# Patient Record
Sex: Female | Born: 1965 | Race: Black or African American | Hispanic: No | Marital: Married | Smoking: Never smoker
Health system: Southern US, Community
[De-identification: ages and names within clinical notes are randomized; demographics above are authoritative.]

## PROBLEM LIST (undated history)

## (undated) DIAGNOSIS — H5789 Other specified disorders of eye and adnexa: Secondary | ICD-10-CM

## (undated) DIAGNOSIS — D649 Anemia, unspecified: Secondary | ICD-10-CM

## (undated) DIAGNOSIS — N938 Other specified abnormal uterine and vaginal bleeding: Secondary | ICD-10-CM

## (undated) DIAGNOSIS — I10 Essential (primary) hypertension: Secondary | ICD-10-CM

## (undated) HISTORY — DX: Anemia, unspecified: D64.9

## (undated) HISTORY — DX: Other specified disorders of eye and adnexa: H57.89

## (undated) HISTORY — PX: NO PAST SURGERIES: SHX2092

## (undated) HISTORY — DX: Other specified abnormal uterine and vaginal bleeding: N93.8

---

## 1998-08-01 ENCOUNTER — Ambulatory Visit (HOSPITAL_COMMUNITY): Admission: RE | Admit: 1998-08-01 | Discharge: 1998-08-01 | Payer: Self-pay | Admitting: Family Medicine

## 1998-08-01 ENCOUNTER — Encounter: Payer: Self-pay | Admitting: Family Medicine

## 1999-02-05 ENCOUNTER — Inpatient Hospital Stay (HOSPITAL_COMMUNITY): Admission: AD | Admit: 1999-02-05 | Discharge: 1999-02-07 | Payer: Self-pay | Admitting: *Deleted

## 1999-06-25 ENCOUNTER — Emergency Department (HOSPITAL_COMMUNITY): Admission: EM | Admit: 1999-06-25 | Discharge: 1999-06-25 | Payer: Self-pay | Admitting: Emergency Medicine

## 2002-02-25 ENCOUNTER — Inpatient Hospital Stay (HOSPITAL_COMMUNITY): Admission: AD | Admit: 2002-02-25 | Discharge: 2002-02-28 | Payer: Self-pay | Admitting: *Deleted

## 2004-07-04 ENCOUNTER — Ambulatory Visit: Payer: Self-pay | Admitting: *Deleted

## 2005-01-24 ENCOUNTER — Ambulatory Visit: Payer: Self-pay | Admitting: Nurse Practitioner

## 2005-02-08 ENCOUNTER — Ambulatory Visit: Payer: Self-pay | Admitting: Nurse Practitioner

## 2005-02-11 ENCOUNTER — Ambulatory Visit: Payer: Self-pay | Admitting: Nurse Practitioner

## 2005-02-22 ENCOUNTER — Ambulatory Visit: Payer: Self-pay | Admitting: Nurse Practitioner

## 2005-09-20 ENCOUNTER — Ambulatory Visit: Payer: Self-pay | Admitting: Nurse Practitioner

## 2005-09-27 ENCOUNTER — Ambulatory Visit: Payer: Self-pay | Admitting: Nurse Practitioner

## 2006-03-14 ENCOUNTER — Ambulatory Visit: Payer: Self-pay | Admitting: *Deleted

## 2006-04-24 ENCOUNTER — Ambulatory Visit: Payer: Self-pay | Admitting: Nurse Practitioner

## 2006-05-21 ENCOUNTER — Ambulatory Visit: Payer: Self-pay | Admitting: Nurse Practitioner

## 2006-10-02 ENCOUNTER — Ambulatory Visit: Payer: Self-pay | Admitting: Nurse Practitioner

## 2007-05-20 ENCOUNTER — Encounter (INDEPENDENT_AMBULATORY_CARE_PROVIDER_SITE_OTHER): Payer: Self-pay | Admitting: Nurse Practitioner

## 2007-05-20 ENCOUNTER — Ambulatory Visit: Payer: Self-pay | Admitting: Family Medicine

## 2007-05-20 LAB — CONVERTED CEMR LAB
ALT: 17 units/L (ref 0–35)
AST: 18 units/L (ref 0–37)
Albumin: 3.6 g/dL (ref 3.5–5.2)
Alkaline Phosphatase: 62 units/L (ref 39–117)
BUN: 10 mg/dL (ref 6–23)
Basophils Absolute: 0.1 10*3/uL (ref 0.0–0.1)
Basophils Relative: 1 % (ref 0–1)
CO2: 26 meq/L (ref 19–32)
Calcium: 8.8 mg/dL (ref 8.4–10.5)
Chloride: 104 meq/L (ref 96–112)
Creatinine, Ser: 0.54 mg/dL (ref 0.40–1.20)
Eosinophils Absolute: 0.2 10*3/uL (ref 0.0–0.7)
Eosinophils Relative: 4 % (ref 0–5)
Glucose, Bld: 81 mg/dL (ref 70–99)
HCT: 29.5 % — ABNORMAL LOW (ref 36.0–46.0)
Hemoglobin: 8 g/dL — ABNORMAL LOW (ref 12.0–15.0)
Lymphocytes Relative: 36 % (ref 12–46)
Lymphs Abs: 2.3 10*3/uL (ref 0.7–3.3)
MCHC: 27.1 g/dL — ABNORMAL LOW (ref 30.0–36.0)
MCV: 71.8 fL — ABNORMAL LOW (ref 78.0–100.0)
Monocytes Absolute: 0.6 10*3/uL (ref 0.2–0.7)
Monocytes Relative: 10 % (ref 3–11)
Neutro Abs: 3.2 10*3/uL (ref 1.7–7.7)
Neutrophils Relative %: 50 % (ref 43–77)
Platelets: 252 10*3/uL (ref 150–400)
Potassium: 4.6 meq/L (ref 3.5–5.3)
RBC: 4.11 M/uL (ref 3.87–5.11)
RDW: 19.3 % — ABNORMAL HIGH (ref 11.5–14.0)
Sodium: 137 meq/L (ref 135–145)
TSH: 3.635 microintl units/mL (ref 0.350–5.50)
Total Bilirubin: 0.3 mg/dL (ref 0.3–1.2)
Total Protein: 7 g/dL (ref 6.0–8.3)
WBC: 6.4 10*3/uL (ref 4.0–10.5)

## 2007-06-17 ENCOUNTER — Encounter (INDEPENDENT_AMBULATORY_CARE_PROVIDER_SITE_OTHER): Payer: Self-pay | Admitting: *Deleted

## 2007-09-22 ENCOUNTER — Ambulatory Visit: Payer: Self-pay | Admitting: Family Medicine

## 2007-12-03 ENCOUNTER — Ambulatory Visit: Payer: Self-pay | Admitting: Internal Medicine

## 2008-06-24 ENCOUNTER — Encounter (INDEPENDENT_AMBULATORY_CARE_PROVIDER_SITE_OTHER): Payer: Self-pay | Admitting: Family Medicine

## 2008-06-24 ENCOUNTER — Ambulatory Visit: Payer: Self-pay | Admitting: Internal Medicine

## 2008-06-24 LAB — CONVERTED CEMR LAB
ALT: 19 units/L (ref 0–35)
AST: 18 units/L (ref 0–37)
Albumin: 3.8 g/dL (ref 3.5–5.2)
Alkaline Phosphatase: 52 units/L (ref 39–117)
BUN: 13 mg/dL (ref 6–23)
Basophils Absolute: 0 10*3/uL (ref 0.0–0.1)
Basophils Relative: 1 % (ref 0–1)
CO2: 28 meq/L (ref 19–32)
Calcium: 9.3 mg/dL (ref 8.4–10.5)
Chloride: 102 meq/L (ref 96–112)
Creatinine, Ser: 0.73 mg/dL (ref 0.40–1.20)
Eosinophils Absolute: 0.2 10*3/uL (ref 0.0–0.7)
Eosinophils Relative: 3 % (ref 0–5)
Glucose, Bld: 82 mg/dL (ref 70–99)
HCT: 29.5 % — ABNORMAL LOW (ref 36.0–46.0)
Hemoglobin: 8.6 g/dL — ABNORMAL LOW (ref 12.0–15.0)
Lymphocytes Relative: 39 % (ref 12–46)
Lymphs Abs: 2.5 10*3/uL (ref 0.7–4.0)
MCHC: 29.2 g/dL — ABNORMAL LOW (ref 30.0–36.0)
MCV: 74.7 fL — ABNORMAL LOW (ref 78.0–100.0)
Monocytes Absolute: 0.7 10*3/uL (ref 0.1–1.0)
Monocytes Relative: 12 % (ref 3–12)
Neutro Abs: 2.9 10*3/uL (ref 1.7–7.7)
Neutrophils Relative %: 46 % (ref 43–77)
Platelets: 329 10*3/uL (ref 150–400)
Potassium: 3.9 meq/L (ref 3.5–5.3)
RBC: 3.95 M/uL (ref 3.87–5.11)
RDW: 15.7 % — ABNORMAL HIGH (ref 11.5–15.5)
Sodium: 140 meq/L (ref 135–145)
TSH: 3.396 microintl units/mL (ref 0.350–4.50)
Total Bilirubin: 0.3 mg/dL (ref 0.3–1.2)
Total Protein: 7.1 g/dL (ref 6.0–8.3)
WBC: 6.3 10*3/uL (ref 4.0–10.5)

## 2008-11-08 ENCOUNTER — Ambulatory Visit: Payer: Self-pay | Admitting: Family Medicine

## 2009-01-30 ENCOUNTER — Ambulatory Visit: Payer: Self-pay | Admitting: Family Medicine

## 2009-03-09 ENCOUNTER — Ambulatory Visit: Payer: Self-pay | Admitting: Internal Medicine

## 2009-08-29 ENCOUNTER — Ambulatory Visit: Payer: Self-pay | Admitting: Internal Medicine

## 2009-08-29 ENCOUNTER — Encounter (INDEPENDENT_AMBULATORY_CARE_PROVIDER_SITE_OTHER): Payer: Self-pay | Admitting: Adult Health

## 2009-08-29 LAB — CONVERTED CEMR LAB
Basophils Absolute: 0 10*3/uL (ref 0.0–0.1)
Basophils Relative: 1 % (ref 0–1)
Eosinophils Absolute: 0.2 10*3/uL (ref 0.0–0.7)
Eosinophils Relative: 4 % (ref 0–5)
HCT: 41 % (ref 36.0–46.0)
Hemoglobin: 13.5 g/dL (ref 12.0–15.0)
Lymphocytes Relative: 38 % (ref 12–46)
Lymphs Abs: 2.4 10*3/uL (ref 0.7–4.0)
MCHC: 32.9 g/dL (ref 30.0–36.0)
MCV: 89.5 fL (ref 78.0–100.0)
Monocytes Absolute: 0.7 10*3/uL (ref 0.1–1.0)
Monocytes Relative: 12 % (ref 3–12)
Neutro Abs: 3 10*3/uL (ref 1.7–7.7)
Neutrophils Relative %: 46 % (ref 43–77)
RBC: 4.58 M/uL (ref 3.87–5.11)
RDW: 12.7 % (ref 11.5–15.5)
WBC: 6.4 10*3/uL (ref 4.0–10.5)

## 2009-09-12 ENCOUNTER — Ambulatory Visit: Payer: Self-pay | Admitting: Internal Medicine

## 2010-03-19 ENCOUNTER — Ambulatory Visit: Payer: Self-pay | Admitting: Advanced Practice Midwife

## 2010-03-19 ENCOUNTER — Inpatient Hospital Stay (HOSPITAL_COMMUNITY): Admission: AD | Admit: 2010-03-19 | Discharge: 2010-03-19 | Payer: Self-pay | Admitting: Obstetrics & Gynecology

## 2010-03-21 ENCOUNTER — Ambulatory Visit (HOSPITAL_COMMUNITY): Admission: RE | Admit: 2010-03-21 | Discharge: 2010-03-21 | Payer: Self-pay | Admitting: Obstetrics & Gynecology

## 2010-03-21 ENCOUNTER — Ambulatory Visit: Payer: Self-pay | Admitting: Nurse Practitioner

## 2010-03-28 ENCOUNTER — Ambulatory Visit: Payer: Self-pay | Admitting: Obstetrics and Gynecology

## 2010-03-28 LAB — CONVERTED CEMR LAB: hCG, Beta Chain, Quant, S: 5.9 milliintl units/mL

## 2010-04-04 ENCOUNTER — Ambulatory Visit: Payer: Self-pay | Admitting: Family Medicine

## 2010-04-05 ENCOUNTER — Encounter (INDEPENDENT_AMBULATORY_CARE_PROVIDER_SITE_OTHER): Payer: Self-pay | Admitting: Internal Medicine

## 2010-04-05 LAB — CONVERTED CEMR LAB
BUN: 15 mg/dL (ref 6–23)
Basophils Absolute: 0 10*3/uL (ref 0.0–0.1)
Basophils Relative: 1 % (ref 0–1)
CO2: 28 meq/L (ref 19–32)
Calcium: 9.3 mg/dL (ref 8.4–10.5)
Chloride: 104 meq/L (ref 96–112)
Creatinine, Ser: 0.55 mg/dL (ref 0.40–1.20)
Eosinophils Absolute: 0.3 10*3/uL (ref 0.0–0.7)
Eosinophils Relative: 5 % (ref 0–5)
Ferritin: 14 ng/mL (ref 10–291)
Glucose, Bld: 93 mg/dL (ref 70–99)
HCT: 37 % (ref 36.0–46.0)
Hemoglobin: 11.2 g/dL — ABNORMAL LOW (ref 12.0–15.0)
Iron: 53 ug/dL (ref 42–145)
Lymphocytes Relative: 36 % (ref 12–46)
Lymphs Abs: 2.1 10*3/uL (ref 0.7–4.0)
MCHC: 30.3 g/dL (ref 30.0–36.0)
MCV: 87.9 fL (ref 78.0–100.0)
Microalb, Ur: 1.08 mg/dL (ref 0.00–1.89)
Monocytes Absolute: 0.7 10*3/uL (ref 0.1–1.0)
Monocytes Relative: 11 % (ref 3–12)
Neutro Abs: 2.8 10*3/uL (ref 1.7–7.7)
Neutrophils Relative %: 48 % (ref 43–77)
Platelets: 341 10*3/uL (ref 150–400)
Potassium: 4.5 meq/L (ref 3.5–5.3)
RBC: 4.21 M/uL (ref 3.87–5.11)
RDW: 14.9 % (ref 11.5–15.5)
Saturation Ratios: 18 % — ABNORMAL LOW (ref 20–55)
Sodium: 139 meq/L (ref 135–145)
TIBC: 292 ug/dL (ref 250–470)
UIBC: 239 ug/dL
WBC: 5.9 10*3/uL (ref 4.0–10.5)

## 2010-12-11 ENCOUNTER — Emergency Department (HOSPITAL_COMMUNITY)
Admission: EM | Admit: 2010-12-11 | Discharge: 2010-12-11 | Disposition: A | Discharge status: Home / Self Care | Payer: Self-pay | Attending: Emergency Medicine | Admitting: Emergency Medicine

## 2010-12-11 DIAGNOSIS — M25519 Pain in unspecified shoulder: Secondary | ICD-10-CM | POA: Insufficient documentation

## 2010-12-11 DIAGNOSIS — M546 Pain in thoracic spine: Secondary | ICD-10-CM | POA: Insufficient documentation

## 2010-12-11 DIAGNOSIS — T148XXA Other injury of unspecified body region, initial encounter: Secondary | ICD-10-CM | POA: Insufficient documentation

## 2010-12-11 DIAGNOSIS — M79609 Pain in unspecified limb: Secondary | ICD-10-CM | POA: Insufficient documentation

## 2010-12-11 DIAGNOSIS — Y9289 Other specified places as the place of occurrence of the external cause: Secondary | ICD-10-CM | POA: Insufficient documentation

## 2010-12-11 DIAGNOSIS — I10 Essential (primary) hypertension: Secondary | ICD-10-CM | POA: Insufficient documentation

## 2010-12-11 DIAGNOSIS — X58XXXA Exposure to other specified factors, initial encounter: Secondary | ICD-10-CM | POA: Insufficient documentation

## 2010-12-11 DIAGNOSIS — R209 Unspecified disturbances of skin sensation: Secondary | ICD-10-CM | POA: Insufficient documentation

## 2010-12-16 LAB — CBC
HCT: 34.2 % — ABNORMAL LOW (ref 36.0–46.0)
Hemoglobin: 11.4 g/dL — ABNORMAL LOW (ref 12.0–15.0)
RBC: 3.94 MIL/uL (ref 3.87–5.11)
RDW: 14.3 % (ref 11.5–15.5)

## 2010-12-16 LAB — GC/CHLAMYDIA PROBE AMP, GENITAL: GC Probe Amp, Genital: POSITIVE — AB

## 2010-12-16 LAB — WET PREP, GENITAL
Clue Cells Wet Prep HPF POC: NONE SEEN
Yeast Wet Prep HPF POC: NONE SEEN

## 2010-12-16 LAB — POCT PREGNANCY, URINE: Preg Test, Ur: POSITIVE

## 2011-01-23 ENCOUNTER — Inpatient Hospital Stay (INDEPENDENT_AMBULATORY_CARE_PROVIDER_SITE_OTHER)
Admission: RE | Admit: 2011-01-23 | Discharge: 2011-01-23 | Disposition: A | Discharge status: Home / Self Care | Payer: Self-pay | Source: Ambulatory Visit | Attending: Family Medicine | Admitting: Family Medicine

## 2011-01-23 DIAGNOSIS — R209 Unspecified disturbances of skin sensation: Secondary | ICD-10-CM

## 2011-06-11 ENCOUNTER — Inpatient Hospital Stay (INDEPENDENT_AMBULATORY_CARE_PROVIDER_SITE_OTHER)
Admission: RE | Admit: 2011-06-11 | Discharge: 2011-06-11 | Disposition: A | Discharge status: Home / Self Care | Payer: Self-pay | Source: Ambulatory Visit | Attending: Emergency Medicine | Admitting: Emergency Medicine

## 2011-06-11 DIAGNOSIS — H6690 Otitis media, unspecified, unspecified ear: Secondary | ICD-10-CM

## 2011-10-05 ENCOUNTER — Encounter (HOSPITAL_COMMUNITY): Payer: Self-pay | Admitting: Pharmacist

## 2011-10-14 NOTE — H&P (Signed)
  Patient name  Abigail Gonzalez DICTATION#  [id number] CSN# [id number]  Juluis Mire, MD 10/14/2011 9:22 AM

## 2011-10-15 ENCOUNTER — Other Ambulatory Visit: Payer: Self-pay

## 2011-10-15 ENCOUNTER — Encounter (HOSPITAL_COMMUNITY): Payer: Self-pay

## 2011-10-15 ENCOUNTER — Encounter (HOSPITAL_COMMUNITY)
Admission: RE | Admit: 2011-10-15 | Discharge: 2011-10-15 | Disposition: A | Discharge status: Home / Self Care | Payer: Self-pay | Source: Ambulatory Visit | Attending: Obstetrics and Gynecology | Admitting: Obstetrics and Gynecology

## 2011-10-15 HISTORY — DX: Essential (primary) hypertension: I10

## 2011-10-15 LAB — CBC
HCT: 36 % (ref 36.0–46.0)
MCH: 29.7 pg (ref 26.0–34.0)
MCHC: 32.8 g/dL (ref 30.0–36.0)
MCV: 90.7 fL (ref 78.0–100.0)
Platelets: 330 10*3/uL (ref 150–400)
RDW: 12.9 % (ref 11.5–15.5)
WBC: 6.6 10*3/uL (ref 4.0–10.5)

## 2011-10-15 LAB — COMPREHENSIVE METABOLIC PANEL
AST: 25 U/L (ref 0–37)
Albumin: 3.5 g/dL (ref 3.5–5.2)
BUN: 14 mg/dL (ref 6–23)
Calcium: 9.5 mg/dL (ref 8.4–10.5)
Chloride: 103 mEq/L (ref 96–112)
Creatinine, Ser: 0.57 mg/dL (ref 0.50–1.10)
Total Protein: 7.3 g/dL (ref 6.0–8.3)

## 2011-10-15 NOTE — H&P (Signed)
NAME:  Abigail Gonzalez, Abigail Gonzalez                 ACCOUNT NO.:  MEDICAL RECORD NO.:  [id number]  LOCATION:                                 FACILITY:  PHYSICIAN:  Juluis Mire, M.D.   DATE OF BIRTH:  08/07/1966  DATE OF ADMISSION: DATE OF DISCHARGE:                             HISTORY & PHYSICAL   The patient is a 46 year old, gravida 4, para 3, abortus 1 female, who presented to the office from [facility] for evaluation of prolonged heavy periods, cycles are regular.  She has 3-4 days of heavy flow with clots and cramping.  She was using nothing for birth control.  Saline infusion ultrasound reveals ovaries to be normal.  Did have an apparent endometrial polyp and thickened myometrium that could be secondary to adenomyosis.  She now presents for hysteroscopic resection.  ALLERGIES:  In terms of allergies, she has no known drug allergies.  MEDICATIONS: 1. Hydrochlorothiazide 25 mg a day. 2. Metoprolol 50 mg a day. 3. Iron sulfate supplementation.  PAST MEDICAL HISTORY:  Significant for history of hypertension, under active management.  SURGICAL HISTORY:  No surgical history.  She has had 3 vaginal deliveries, 1 miscarriage.  SOCIAL HISTORY:  No tobacco, alcohol use.  FAMILY HISTORY:  Noncontributory.  REVIEW OF SYSTEMS:  Noncontributory.  PHYSICAL EXAMINATION:  GENERAL:  The patient is afebrile. VITAL SIGNS:  Stable vital signs. HEENT:  The patient is normocephalic.  Pupils equal, round, and reactive to light and accommodation.  Extraocular movements are intact.  Sclerae and conjunctivae are clear.  Oropharynx clear. NECK:  Without thyromegaly. BREASTS:  Not examined. LUNGS:  Clear. CARDIOVASCULAR SYSTEM:  Regular rhythm and rate.  There were no murmurs or gallops. ABDOMEN:  Benign.  No mass, organomegaly, or tenderness. PELVIC:  Normal external genitalia.  Vaginal mucosa is clear.  Cervix unremarkable.  Uterus normal size, shape, and contour.  Adnexa free of masses or  tenderness. EXTREMITIES:  Trace edema. NEUROLOGIC:  Grossly within normal limits.  IMPRESSION: 1. Menorrhagia with endometrial polyp. 2. Hypertension.  PLAN:  The patient will undergo hysteroscopic resection of polyp.  The risks of surgery have been discussed including the risk of infection. Risk of vascular injury could lead to hemorrhage requiring transfusion with the risk of AIDS, or hepatitis.  Excessive bleeding could require hysterectomy.  There is a risk of perforation with injury to adjacent organs requiring further exploratory surgery.  Risk of deep venous thrombosis and pulmonary embolus.  The patient expressed understanding of indications and risks.     Juluis Mire, M.D.     JSM/MEDQ  D:  10/14/2011  T:  10/15/2011  Job:  [id number]

## 2011-10-15 NOTE — Patient Instructions (Addendum)
20 Abigail Gonzalez  10/15/2011   Your procedure is scheduled on:  10/17/11  Enter through the Main Entrance of [facility] at 1130 AM.  Pick up the phone at the desk and dial [phone].   Call this number if you have problems the morning of surgery: [phone]   Remember:   Do not eat food:After Midnight.  Do not drink clear liquids: 4 Hours before arrival.  Take these medicines the morning of surgery with A SIP OF WATER: Blood Pressure medications (2)   Do not wear jewelry, make-up or nail polish.  Do not wear lotions, powders, or perfumes. You may wear deodorant.  Do not shave 48 hours prior to surgery.  Do not bring valuables to the hospital.  Contacts, dentures or bridgework may not be worn into surgery.  Leave suitcase in the car. After surgery it may be brought to your room.  For patients admitted to the hospital, checkout time is 11:00 AM the day of discharge.   Patients discharged the day of surgery will not be allowed to drive home.  Name and phone number of your driver: husband Special Instructions: CHG Shower Use Special Wash: 1/2 bottle night before surgery and 1/2 bottle morning of surgery.   Please read over the following fact sheets that you were given: NA

## 2011-10-15 NOTE — Pre-Procedure Instructions (Signed)
EKG accepted by Dr. Malen Gauze (Anes)  No orders given.  Patton Salles, RN

## 2011-10-17 ENCOUNTER — Other Ambulatory Visit: Payer: Self-pay | Admitting: Obstetrics and Gynecology

## 2011-10-17 ENCOUNTER — Encounter (HOSPITAL_COMMUNITY): Payer: Self-pay | Admitting: Anesthesiology

## 2011-10-17 ENCOUNTER — Encounter (HOSPITAL_COMMUNITY)
Admission: RE | Disposition: A | Discharge status: Home / Self Care | Payer: Self-pay | Source: Ambulatory Visit | Attending: Obstetrics and Gynecology

## 2011-10-17 ENCOUNTER — Ambulatory Visit (HOSPITAL_COMMUNITY)
Admission: RE | Admit: 2011-10-17 | Discharge: 2011-10-17 | Disposition: A | Discharge status: Home / Self Care | Payer: Self-pay | Source: Ambulatory Visit | Attending: Obstetrics and Gynecology | Admitting: Obstetrics and Gynecology

## 2011-10-17 ENCOUNTER — Ambulatory Visit (HOSPITAL_COMMUNITY): Payer: Self-pay | Admitting: Anesthesiology

## 2011-10-17 DIAGNOSIS — I10 Essential (primary) hypertension: Secondary | ICD-10-CM | POA: Insufficient documentation

## 2011-10-17 DIAGNOSIS — Z01818 Encounter for other preprocedural examination: Secondary | ICD-10-CM | POA: Insufficient documentation

## 2011-10-17 DIAGNOSIS — N920 Excessive and frequent menstruation with regular cycle: Secondary | ICD-10-CM | POA: Insufficient documentation

## 2011-10-17 DIAGNOSIS — Z01812 Encounter for preprocedural laboratory examination: Secondary | ICD-10-CM | POA: Insufficient documentation

## 2011-10-17 DIAGNOSIS — N840 Polyp of corpus uteri: Secondary | ICD-10-CM | POA: Insufficient documentation

## 2011-10-17 LAB — HCG, SERUM, QUALITATIVE: Preg, Serum: NEGATIVE

## 2011-10-17 SURGERY — DILATATION & CURETTAGE/HYSTEROSCOPY WITH RESECTOCOPE
Anesthesia: General | Site: Vagina | Wound class: Clean Contaminated

## 2011-10-17 MED ORDER — FENTANYL CITRATE 0.05 MG/ML IJ SOLN
INTRAMUSCULAR | Status: AC
  Filled 2011-10-17: qty 5

## 2011-10-17 MED ORDER — LIDOCAINE HCL (CARDIAC) 20 MG/ML IV SOLN
INTRAVENOUS | Status: DC | PRN
  Administered 2011-10-17: 40 mg via INTRAVENOUS

## 2011-10-17 MED ORDER — PROPOFOL 10 MG/ML IV EMUL
INTRAVENOUS | Status: DC | PRN
  Administered 2011-10-17: 200 mg via INTRAVENOUS

## 2011-10-17 MED ORDER — ONDANSETRON HCL 4 MG/2ML IJ SOLN
INTRAMUSCULAR | Status: DC | PRN
  Administered 2011-10-17: 4 mg via INTRAVENOUS

## 2011-10-17 MED ORDER — GLYCINE 1.5 % IR SOLN
Status: DC | PRN
  Administered 2011-10-17: 3000 mL

## 2011-10-17 MED ORDER — MIDAZOLAM HCL 2 MG/2ML IJ SOLN
INTRAMUSCULAR | Status: AC
Start: 2011-10-17 — End: 2011-10-17
  Filled 2011-10-17: qty 2

## 2011-10-17 MED ORDER — DEXAMETHASONE SODIUM PHOSPHATE 10 MG/ML IJ SOLN
INTRAMUSCULAR | Status: DC | PRN
  Administered 2011-10-17: 10 mg via INTRAVENOUS

## 2011-10-17 MED ORDER — MIDAZOLAM HCL 5 MG/5ML IJ SOLN
INTRAMUSCULAR | Status: DC | PRN
  Administered 2011-10-17: 2 mg via INTRAVENOUS

## 2011-10-17 MED ORDER — FENTANYL CITRATE 0.05 MG/ML IJ SOLN
INTRAMUSCULAR | Status: DC | PRN
  Administered 2011-10-17: 100 ug via INTRAVENOUS

## 2011-10-17 MED ORDER — LACTATED RINGERS IV SOLN
INTRAVENOUS | Status: DC
  Administered 2011-10-17 (×3): via INTRAVENOUS

## 2011-10-17 MED ORDER — LIDOCAINE-EPINEPHRINE (PF) 1 %-1:200000 IJ SOLN
INTRAMUSCULAR | Status: DC | PRN
  Administered 2011-10-17: 15 mL

## 2011-10-17 MED ORDER — KETOROLAC TROMETHAMINE 30 MG/ML IJ SOLN
INTRAMUSCULAR | Status: DC | PRN
  Administered 2011-10-17: 30 mg via INTRAVENOUS

## 2011-10-17 SURGICAL SUPPLY — 14 items
CANISTER SUCTION 2500CC (MISCELLANEOUS) ×2 IMPLANT
CATH ROBINSON RED A/P 16FR (CATHETERS) ×2 IMPLANT
CLOTH BEACON ORANGE TIMEOUT ST (SAFETY) ×2 IMPLANT
CONTAINER PREFILL 10% NBF 60ML (FORM) ×4 IMPLANT
ELECT REM PT RETURN 9FT ADLT (ELECTROSURGICAL) ×2
ELECTRODE REM PT RTRN 9FT ADLT (ELECTROSURGICAL) ×1 IMPLANT
GLOVE BIO SURGEON STRL SZ7 (GLOVE) ×4 IMPLANT
GOWN PREVENTION PLUS LG XLONG (DISPOSABLE) ×2 IMPLANT
GOWN STRL REIN XL XLG (GOWN DISPOSABLE) ×2 IMPLANT
LOOP ANGLED CUTTING 22FR (CUTTING LOOP) IMPLANT
NEEDLE SPNL 20GX3.5 QUINCKE YW (NEEDLE) ×2 IMPLANT
PACK HYSTEROSCOPY LF (CUSTOM PROCEDURE TRAY) ×2 IMPLANT
TOWEL OR 17X24 6PK STRL BLUE (TOWEL DISPOSABLE) ×4 IMPLANT
WATER STERILE IRR 1000ML POUR (IV SOLUTION) ×2 IMPLANT

## 2011-10-17 NOTE — Anesthesia Preprocedure Evaluation (Addendum)
Anesthesia Evaluation  Patient identified by MRN, date of birth, ID band Patient awake    Reviewed: Allergy & Precautions, H&P , Patient's Chart, lab work & pertinent test results, reviewed documented beta blocker date and time   Airway Mallampati: II TM Distance: >3 FB Neck ROM: full    Dental No notable dental hx.    Pulmonary  clear to auscultation  Pulmonary exam normal       Cardiovascular hypertension, Pt. on medications regular Normal    Neuro/Psych    GI/Hepatic   Endo/Other    Renal/GU      Musculoskeletal   Abdominal   Peds  Hematology   Anesthesia Other Findings   Reproductive/Obstetrics                           Anesthesia Physical Anesthesia Plan  ASA: II  Anesthesia Plan: General   Post-op Pain Management:    Induction: Intravenous  Airway Management Planned: LMA  Additional Equipment:   Intra-op Plan:   Post-operative Plan:   Informed Consent: I have reviewed the patients History and Physical, chart, labs and discussed the procedure including the risks, benefits and alternatives for the proposed anesthesia with the patient or authorized representative who has indicated his/her understanding and acceptance.   Dental Advisory Given  Plan Discussed with: CRNA and Surgeon  Anesthesia Plan Comments: (  Discussed  general anesthesia, including possible nausea, instrumentation of airway, sore throat,pulmonary aspiration, etc. I asked if the were any outstanding questions, or  concerns before we proceeded. )        Anesthesia Quick Evaluation

## 2011-10-17 NOTE — Brief Op Note (Signed)
10/17/2011  1:37 PM  PATIENT:  Doreen Salvage Fluharty  46 y.o. female  PRE-OPERATIVE DIAGNOSIS:  polyps  POST-OPERATIVE DIAGNOSIS:  * No post-op diagnosis entered *  PROCEDURE:  Procedure(s): DILATATION & CURETTAGE/HYSTEROSCOPY WITH RESECTOCOPE  SURGEON:  Surgeon(s): Juluis Mire, MD  PHYSICIAN ASSISTANT:   ASSISTANTS: none   ANESTHESIA:   general  EBL:     BLOOD ADMINISTERED:none  DRAINS: none   LOCAL MEDICATIONS USED:  LIDOCAINE 15CC  SPECIMEN:  Source of Specimen:  endometrial ressection and curetting  DISPOSITION OF SPECIMEN:  PATHOLOGY  COUNTS:  YES  TOURNIQUET:  * No tourniquets in log *  DICTATION: .Other Dictation: Dictation Number [id number]  PLAN OF CARE: Discharge to home after PACU  PATIENT DISPOSITION:  PACU - hemodynamically stable.   Delay start of Pharmacological VTE agent (>24hrs) due to surgical blood loss or risk of bleeding:  {YES/NO/NOT APPLICABLE:20182

## 2011-10-17 NOTE — Progress Notes (Signed)
Patient ID: Abigail Gonzalez, female   DOB: Jul 10, 1966, 47 y.o.   MRN: [id number] Patient name DICTATION#  [id number] CSN# [id number]   Abigail Mire, MD 10/17/2011 1:38 PM

## 2011-10-17 NOTE — Transfer of Care (Signed)
Immediate Anesthesia Transfer of Care Note  Patient: Abigail Gonzalez  Procedure(s) Performed:  DILATATION & CURETTAGE/HYSTEROSCOPY WITH RESECTOCOPE  Patient Location: PACU  Anesthesia Type: General  Level of Consciousness: awake, alert , oriented and patient cooperative  Airway & Oxygen Therapy: Patient Spontanous Breathing and Patient connected to nasal cannula oxygen  Post-op Assessment: Report given to PACU RN, Post -op Vital signs reviewed and stable and Patient moving all extremities X 4  Post vital signs: Reviewed and stable Filed Vitals:   10/17/11 1354  BP:   Pulse: 82  Temp: 36.8 C  Resp: 16    Complications: No apparent anesthesia complications

## 2011-10-17 NOTE — H&P (Signed)
  History aqnd physical exam unchanged

## 2011-10-18 NOTE — Op Note (Signed)
NAMEGLENDORA, Abigail Gonzalez            ACCOUNT NO.:  [id number]  MEDICAL RECORD NO.:  [id number]  LOCATION:  [facility]                          FACILITY:  [facility]  PHYSICIAN:  Juluis Mire, M.D.   DATE OF BIRTH:  May 14, 1966  DATE OF PROCEDURE:  10/17/2011 DATE OF DISCHARGE:  10/17/2011                              OPERATIVE REPORT   PREOPERATIVE DIAGNOSIS:  Endometrial polyp.  POSTOPERATIVE DIAGNOSIS:  Endometrial polyp.  OPERATIVE PROCEDURE:  Paracervical block.  Hysteroscopy with resection of endometrium and polyps along with endometrial curettings.  SURGEON:  Juluis Mire, MD  ANESTHESIA:  General.  ESTIMATED BLOOD LOSS:  Minimal.  PACKS:  None.  DRAINS:  None.  INTRAOPERATIVE BLOOD PLACED:  None.  COMPLICATIONS:  None.  INDICATION:  As dictated in history and physical.  PROCEDURE:  The patient was taken to the OR and placed in supine position.  After satisfactory level of general anesthesia was obtained, the patient was placed in the dorsal lithotomy position using the Allen stirrups.  The perineum and vagina were prepped out with Betadine and draped as a sterile field.  The speculum was placed in the vaginal vault.  The cervix was grasped with single-tooth tenaculum. Paracervical block 1% Xylocaine with epinephrine was instituted, we used approximately 15 mL.  At this point in time, uterus sounded to about 9 cm.  Cervix was serially dilated to a size 33 Pratt dilator.  Operative hysteroscope was introduced.  Intrauterine cavity was distended using glycerine.  Visualization revealed markedly thickened endometrium with numerous polyp like outgrowths.  Using the resectoscope, we were able resect all these areas completely.  At the end of the procedure, there was no evidence of any thickened endometrium or polyps.  No signs of perforation or active vaginal bleeding. At this point in time, curettings were obtained.  Single-tooth tenaculum and speculum were then removed.  The  patient was taken out of the dorsal lithotomy position.  Once alert and extubated, transferred to the recovery room in good condition.  Sponge, instrument, and needle counts were reported as correct by circulating nurse x2.     Juluis Mire, M.D.     JSM/MEDQ  D:  10/17/2011  T:  10/18/2011  Job:  [id number]

## 2011-10-18 NOTE — Anesthesia Postprocedure Evaluation (Signed)
  Anesthesia Post-op Note  Patient: Abigail Gonzalez  Procedure(s) Performed:  DILATATION & CURETTAGE/HYSTEROSCOPY WITH RESECTOCOPE  Patient is awake and responsive. Pain and nausea are reasonably well controlled. Vital signs are stable and clinically acceptable. Oxygen saturation is clinically acceptable. There are no apparent anesthetic complications at this time. Patient is ready for discharge.

## 2012-12-21 ENCOUNTER — Encounter (HOSPITAL_COMMUNITY): Payer: Self-pay

## 2012-12-21 ENCOUNTER — Emergency Department (HOSPITAL_COMMUNITY)
Admission: EM | Admit: 2012-12-21 | Discharge: 2012-12-21 | Disposition: A | Discharge status: Home / Self Care | Payer: No Typology Code available for payment source | Source: Home / Self Care | Attending: Family Medicine | Admitting: Family Medicine

## 2012-12-21 DIAGNOSIS — M25512 Pain in left shoulder: Secondary | ICD-10-CM

## 2012-12-21 DIAGNOSIS — R7309 Other abnormal glucose: Secondary | ICD-10-CM

## 2012-12-21 DIAGNOSIS — D649 Anemia, unspecified: Secondary | ICD-10-CM | POA: Insufficient documentation

## 2012-12-21 DIAGNOSIS — I10 Essential (primary) hypertension: Secondary | ICD-10-CM

## 2012-12-21 DIAGNOSIS — M25519 Pain in unspecified shoulder: Secondary | ICD-10-CM

## 2012-12-21 DIAGNOSIS — IMO0002 Reserved for concepts with insufficient information to code with codable children: Secondary | ICD-10-CM

## 2012-12-21 DIAGNOSIS — S43402A Unspecified sprain of left shoulder joint, initial encounter: Secondary | ICD-10-CM | POA: Insufficient documentation

## 2012-12-21 DIAGNOSIS — S43402S Unspecified sprain of left shoulder joint, sequela: Secondary | ICD-10-CM

## 2012-12-21 DIAGNOSIS — R739 Hyperglycemia, unspecified: Secondary | ICD-10-CM

## 2012-12-21 HISTORY — DX: Unspecified sprain of left shoulder joint, initial encounter: S43.402A

## 2012-12-21 LAB — LIPID PANEL: LDL Cholesterol: 107 mg/dL — ABNORMAL HIGH (ref 0–99)

## 2012-12-21 LAB — CBC
HCT: 27.6 % — ABNORMAL LOW (ref 36.0–46.0)
MCHC: 30.8 g/dL (ref 30.0–36.0)
MCV: 72.4 fL — ABNORMAL LOW (ref 78.0–100.0)
RDW: 17 % — ABNORMAL HIGH (ref 11.5–15.5)

## 2012-12-21 LAB — HEMOGLOBIN A1C: Hgb A1c MFr Bld: 5.7 % — ABNORMAL HIGH (ref <5.7)

## 2012-12-21 LAB — COMPREHENSIVE METABOLIC PANEL
ALT: 18 U/L (ref 0–35)
Albumin: 3.6 g/dL (ref 3.5–5.2)
Alkaline Phosphatase: 74 U/L (ref 39–117)
Glucose, Bld: 113 mg/dL — ABNORMAL HIGH (ref 70–99)
Potassium: 3.5 mEq/L (ref 3.5–5.1)
Sodium: 138 mEq/L (ref 135–145)
Total Protein: 7.8 g/dL (ref 6.0–8.3)

## 2012-12-21 MED ORDER — HYDROCHLOROTHIAZIDE 25 MG PO TABS: 25.0000 mg | ORAL_TABLET | Freq: Every day | ORAL | Status: DC

## 2012-12-21 MED ORDER — NAPROXEN 500 MG PO TABS: 500.0000 mg | ORAL_TABLET | Freq: Two times a day (BID) | ORAL | Status: DC | PRN

## 2012-12-21 MED ORDER — METOPROLOL TARTRATE 50 MG PO TABS: 50.0000 mg | ORAL_TABLET | Freq: Two times a day (BID) | ORAL | Status: DC

## 2012-12-21 NOTE — ED Provider Notes (Signed)
History     CSN: [id number]  Arrival date & time 12/21/12  1017   First MD Initiated Contact with Patient 12/21/12 1035      Chief Complaint  Patient presents with  . Shoulder Pain  . Medication Refill    The history is provided by the patient. The history is limited by a language barrier. A language interpreter was used.   Pt reports that she is in need of refills of her blood pressure medications.   Pt says that she is almost out of it completely.  Pt says that she is having left shoulder pain since the last 2 years that comes intermittently.  She says that she had been taking tramadol for the pain in the shoulder.   Pt says that she has not taken her blood pressure medications.  She also takes OTC NSAIDS as needed for her shoulder.    Past Medical History  Diagnosis Date  . Hypertension     Past Surgical History  Procedure Laterality Date  . No past surgeries      No family history on file.  History  Substance Use Topics  . Smoking status: Never Smoker   . Smokeless tobacco: Not on file  . Alcohol Use: No    OB History   Grav Para Term Preterm Abortions TAB SAB Ect Mult Living                  Review of Systems  HENT: Positive for congestion.   Musculoskeletal: Positive for arthralgias.  All other systems reviewed and are negative.    Allergies  Review of patient's allergies indicates no known allergies.  Home Medications   Current Outpatient Rx  Name  Route  Sig  Dispense  Refill  . traMADol (ULTRAM) 50 MG tablet   Oral   Take 50 mg by mouth every 6 (six) hours as needed for pain.         . ferrous sulfate 325 (65 FE) MG tablet   Oral   Take 325 mg by mouth daily with breakfast.           . hydrochlorothiazide (HYDRODIURIL) 25 MG tablet   Oral   Take 25 mg by mouth daily.           Marland Kitchen ibuprofen (ADVIL,MOTRIN) 800 MG tablet   Oral   Take 800 mg by mouth every 8 (eight) hours as needed. For pain          . metoprolol (LOPRESSOR) 50 MG  tablet   Oral   Take 50 mg by mouth 2 (two) times daily.             BP 163/90  Pulse 75  Temp(Src) 98.5 F (36.9 C) (Oral)  Resp 16  SpO2 100%  LMP 12/06/2012  Physical Exam  Nursing note and vitals reviewed. Constitutional: She is oriented to person, place, and time. She appears well-developed and well-nourished. No distress.  HENT:  Head: Normocephalic and atraumatic.  Eyes: Conjunctivae and EOM are normal. Pupils are equal, round, and reactive to light.  Neck: Normal range of motion. Neck supple. No JVD present. No tracheal deviation present. No thyromegaly present.  Cardiovascular: Normal rate, regular rhythm and normal heart sounds.   No murmur heard. Pulmonary/Chest: Effort normal and breath sounds normal. No respiratory distress. She has no wheezes. She has no rales. She exhibits no tenderness.  Abdominal: Soft. Bowel sounds are normal. There is no tenderness. There is no rebound.  Musculoskeletal: Normal  range of motion. She exhibits tenderness. She exhibits no edema.       Arms: Lymphadenopathy:    She has no cervical adenopathy.  Neurological: She is alert and oriented to person, place, and time.  Skin: Skin is warm and dry. No rash noted. No erythema. No pallor.  Psychiatric: She has a normal mood and affect. Her behavior is normal. Judgment and thought content normal.    ED Course  Procedures (including critical care time)  Labs Reviewed - No data to display No results found.   No diagnosis found.    MDM  IMPRESSION  Musculoskeletal left shoulder sprain  Hypertension  Hyperglycemia   RECOMMENDATIONS / PLAN Check labs today:  CMP, A1c, lipid panel, Vit D Refilled her blood pressure medications today Trial of naproxen prn for shoulder pain Recheck BP (nurse visit) in 2 weeks Pt declined flu vaccine  FOLLOW UP 2 weeks for BP check with nurse 6 weeks to follow up left shoulder  The patient was given clear instructions to go to ER or return  to medical center if symptoms don't improve, worsen or new problems develop.  The patient verbalized understanding.  The patient was told to call to get lab results if they haven't heard anything in the next week.     Results for orders placed during the hospital encounter of 12/21/12  COMPREHENSIVE METABOLIC PANEL      Result Value Range   Sodium 138  135 - 145 mEq/L   Potassium 3.5  3.5 - 5.1 mEq/L   Chloride 101  96 - 112 mEq/L   CO2 27  19 - 32 mEq/L   Glucose, Bld 113 (*) 70 - 99 mg/dL   BUN 12  6 - 23 mg/dL   Creatinine, Ser 4.54  0.50 - 1.10 mg/dL   Calcium 9.2  8.4 - 09.8 mg/dL   Total Protein 7.8  6.0 - 8.3 g/dL   Albumin 3.6  3.5 - 5.2 g/dL   AST 19  0 - 37 U/L   ALT 18  0 - 35 U/L   Alkaline Phosphatase 74  39 - 117 U/L   Total Bilirubin 0.3  0.3 - 1.2 mg/dL   GFR calc non Af Amer >90  >90 mL/min   GFR calc Af Amer >90  >90 mL/min  CBC      Result Value Range   WBC 5.6  4.0 - 10.5 K/uL   RBC 3.81 (*) 3.87 - 5.11 MIL/uL   Hemoglobin 8.5 (*) 12.0 - 15.0 g/dL   HCT 11.9 (*) 14.7 - 82.9 %   MCV 72.4 (*) 78.0 - 100.0 fL   MCH 22.3 (*) 26.0 - 34.0 pg   MCHC 30.8  30.0 - 36.0 g/dL   RDW 56.2 (*) 13.0 - 86.5 %   Platelets    150 - 400 K/uL   Value: PLATELET CLUMPING, SUGGEST RECOLLECTION OF SAMPLE IN CITRATE TUBE.  LIPID PANEL      Result Value Range   Cholesterol 176  0 - 200 mg/dL   Triglycerides 53  <784 mg/dL   HDL 58  >69 mg/dL   Total CHOL/HDL Ratio 3.0     VLDL 11  0 - 40 mg/dL   LDL Cholesterol 629 (*) 0 - 99 mg/dL  HEMOGLOBIN B2W      Result Value Range   Hemoglobin A1C 5.7 (*) <5.7 %   Mean Plasma Glucose 117 (*) <117 mg/dL  VITAMIN D 25 HYDROXY  Result Value Range   Vit D, 25-Hydroxy 21 (*) 30 - 89 ng/mL          Cleora Fleet, MD 12/22/12 [phone]

## 2012-12-21 NOTE — ED Notes (Signed)
Patient complains of left shoulder x 1 month. Also needs refill on hypertension medication.

## 2012-12-22 DIAGNOSIS — R7303 Prediabetes: Secondary | ICD-10-CM | POA: Insufficient documentation

## 2012-12-22 DIAGNOSIS — E559 Vitamin D deficiency, unspecified: Secondary | ICD-10-CM | POA: Insufficient documentation

## 2012-12-22 HISTORY — DX: Vitamin D deficiency, unspecified: E55.9

## 2012-12-22 LAB — VITAMIN D 25 HYDROXY (VIT D DEFICIENCY, FRACTURES): Vit D, 25-Hydroxy: 21 ng/mL — ABNORMAL LOW (ref 30–89)

## 2012-12-22 NOTE — Progress Notes (Signed)
Quick Note:  Please notify patient that her blood counts came back low and she is anemic. She needs to start taking daily iron. Please call in iron sulfate 325 mg, take 1 po daily, #30, RFX3, Recheck CBC in 1 month, her vitamin D is low and she should take over the counter Vit D 1000 IU daily. She has prediabetes. Please mail her some information on prediabetes diet and physical activity.   Rodney Langton, MD, CDE, FAAFP [facility] [address] [place], [place]   ______

## 2012-12-23 ENCOUNTER — Telehealth (HOSPITAL_COMMUNITY): Payer: Self-pay

## 2013-05-15 ENCOUNTER — Emergency Department (INDEPENDENT_AMBULATORY_CARE_PROVIDER_SITE_OTHER)
Admission: EM | Admit: 2013-05-15 | Discharge: 2013-05-15 | Disposition: A | Discharge status: Home / Self Care | Payer: Self-pay | Source: Home / Self Care | Attending: Family Medicine | Admitting: Family Medicine

## 2013-05-15 ENCOUNTER — Encounter (HOSPITAL_COMMUNITY): Payer: Self-pay | Admitting: *Deleted

## 2013-05-15 DIAGNOSIS — I10 Essential (primary) hypertension: Secondary | ICD-10-CM

## 2013-05-15 MED ORDER — HYDROCHLOROTHIAZIDE 25 MG PO TABS: 25.0000 mg | ORAL_TABLET | Freq: Every day | ORAL | Status: DC

## 2013-05-15 MED ORDER — METOPROLOL TARTRATE 50 MG PO TABS: 50.0000 mg | ORAL_TABLET | Freq: Two times a day (BID) | ORAL | Status: DC

## 2013-05-15 NOTE — ED Notes (Signed)
Will run out of hydrochlorothiazide and metoprolol tomorrow.  Does not have PCP.

## 2013-05-15 NOTE — ED Provider Notes (Signed)
CSN: [id number]     Arrival date & time 05/15/13  0912 History     First MD Initiated Contact with Patient 05/15/13 [phone]     Chief Complaint  Patient presents with  . Medication Refill   (Consider location/radiation/quality/duration/timing/severity/associated sxs/prior Treatment) Patient is a 47 y.o. female presenting with hypertension. The history is provided by the patient.  Hypertension This is a chronic problem. Episode onset: only has 1 pill left of bp meds, here for refill. The problem has not changed since onset.Pertinent negatives include no chest pain, no abdominal pain and no headaches.    Past Medical History  Diagnosis Date  . Hypertension    Past Surgical History  Procedure Laterality Date  . No past surgeries     No family history on file. History  Substance Use Topics  . Smoking status: Never Smoker   . Smokeless tobacco: Not on file  . Alcohol Use: No   OB History   Grav Para Term Preterm Abortions TAB SAB Ect Mult Living                 Review of Systems  Constitutional: Negative.   Respiratory: Negative.   Cardiovascular: Negative for chest pain, palpitations and leg swelling.  Gastrointestinal: Negative for abdominal pain.  Neurological: Negative for dizziness and headaches.    Allergies  Review of patient's allergies indicates no known allergies.  Home Medications   Current Outpatient Rx  Name  Route  Sig  Dispense  Refill  . Cholecalciferol (VITAMIN D PO)   Oral   Take by mouth.         . ferrous sulfate 325 (65 FE) MG tablet   Oral   Take 325 mg by mouth daily with breakfast.           . hydrochlorothiazide (HYDRODIURIL) 25 MG tablet   Oral   Take 1 tablet (25 mg total) by mouth daily.   30 tablet   3   . metoprolol (LOPRESSOR) 50 MG tablet   Oral   Take 1 tablet (50 mg total) by mouth 2 (two) times daily.   60 tablet   3   . naproxen (NAPROSYN) 500 MG tablet   Oral   Take 1 tablet (500 mg total) by mouth 2 (two) times  daily between meals as needed (left shoulder pain).   30 tablet   0   . hydrochlorothiazide (HYDRODIURIL) 25 MG tablet   Oral   Take 1 tablet (25 mg total) by mouth daily.   30 tablet   1   . metoprolol (LOPRESSOR) 50 MG tablet   Oral   Take 1 tablet (50 mg total) by mouth 2 (two) times daily.   60 tablet   1   . traMADol (ULTRAM) 50 MG tablet   Oral   Take 50 mg by mouth every 6 (six) hours as needed for pain.          BP 173/82  Pulse 85  Temp(Src) 98.1 F (36.7 C)  Resp 16  SpO2 100%  LMP 04/22/2013 Physical Exam  Nursing note and vitals reviewed. Constitutional: She is oriented to person, place, and time. She appears well-developed and well-nourished.  Neck: Normal range of motion. Neck supple.  Cardiovascular: Regular rhythm, normal heart sounds and intact distal pulses.   Pulmonary/Chest: Effort normal and breath sounds normal.  Lymphadenopathy:    She has no cervical adenopathy.  Neurological: She is alert and oriented to person, place, and time.  Skin: Skin  is warm and dry.    ED Course   Procedures (including critical care time)  Labs Reviewed - No data to display No results found. 1. Hypertension, essential, benign     MDM    Linna Hoff, MD 05/15/13 [phone]

## 2013-06-30 ENCOUNTER — Encounter: Payer: Self-pay | Admitting: Internal Medicine

## 2013-06-30 ENCOUNTER — Ambulatory Visit: Payer: No Typology Code available for payment source | Attending: Internal Medicine | Admitting: Internal Medicine

## 2013-06-30 VITALS — BP 147/91 | HR 80 | Temp 98.1°F | Resp 16 | Ht 63.39 in | Wt 191.0 lb

## 2013-06-30 DIAGNOSIS — R7303 Prediabetes: Secondary | ICD-10-CM

## 2013-06-30 DIAGNOSIS — E559 Vitamin D deficiency, unspecified: Secondary | ICD-10-CM

## 2013-06-30 DIAGNOSIS — I10 Essential (primary) hypertension: Secondary | ICD-10-CM | POA: Insufficient documentation

## 2013-06-30 DIAGNOSIS — D649 Anemia, unspecified: Secondary | ICD-10-CM

## 2013-06-30 DIAGNOSIS — R7309 Other abnormal glucose: Secondary | ICD-10-CM | POA: Insufficient documentation

## 2013-06-30 MED ORDER — FERROUS SULFATE 325 (65 FE) MG PO TABS: 325.0000 mg | ORAL_TABLET | Freq: Every day | ORAL | Status: DC

## 2013-06-30 MED ORDER — NAPROXEN 500 MG PO TABS: 500.0000 mg | ORAL_TABLET | Freq: Two times a day (BID) | ORAL | Status: DC | PRN

## 2013-06-30 MED ORDER — TRAMADOL HCL 50 MG PO TABS: 50.0000 mg | ORAL_TABLET | Freq: Four times a day (QID) | ORAL | Status: DC | PRN

## 2013-06-30 MED ORDER — HYDROCHLOROTHIAZIDE 25 MG PO TABS: 25.0000 mg | ORAL_TABLET | Freq: Every day | ORAL | Status: DC

## 2013-06-30 MED ORDER — METOPROLOL TARTRATE 50 MG PO TABS: 50.0000 mg | ORAL_TABLET | Freq: Two times a day (BID) | ORAL | Status: DC

## 2013-06-30 MED ORDER — VITAMIN D 50 MCG (2000 UT) PO TABS: 2000.0000 [IU] | ORAL_TABLET | Freq: Every day | ORAL | Status: DC

## 2013-06-30 NOTE — Progress Notes (Signed)
Pt is here to establish care. Pt is requesting medication for her HTN.

## 2013-06-30 NOTE — Progress Notes (Signed)
Patient ID: Abigail Gonzalez, female   DOB: 09-Nov-1965, 47 y.o.   MRN: [id number] Patient Demographics  Abigail Gonzalez, is a 47 y.o. female  [id number]  [id number]  DOB - April 26, 1966  Chief Complaint  Patient presents with  . Establish Care        Subjective:   Abigail Gonzalez today is here to establish primary care. Patient is a 47 year old female with history of hypertension, prediabetes is here to establish care. Patient has No headache, No chest pain, No abdominal pain - No Nausea, No new weakness tingling or numbness, No Cough - SOB. States she wants refills on her blood pressure medications.  Objective:    Filed Vitals:   06/30/13 1013  BP: 147/91  Pulse: 80  Temp: 98.1 F (36.7 C)  TempSrc: Oral  Resp: 16  Height: 5' 3.39" (1.61 m)  Weight: 191 lb (86.637 kg)  SpO2: 100%     ALLERGIES:  No Known Allergies  PAST MEDICAL HISTORY: Past Medical History  Diagnosis Date  . Hypertension     PAST SURGICAL HISTORY: Past Surgical History  Procedure Laterality Date  . No past surgeries      FAMILY HISTORY: History reviewed. No pertinent family history.  MEDICATIONS AT HOME: Prior to Admission medications   Medication Sig Start Date End Date Taking? Authorizing Provider  Cholecalciferol (VITAMIN D PO) Take by mouth.    Historical Provider, MD  ferrous sulfate 325 (65 FE) MG tablet Take 1 tablet (325 mg total) by mouth daily with breakfast. 06/30/13   Ripudeep Jenna Luo, MD  hydrochlorothiazide (HYDRODIURIL) 25 MG tablet Take 1 tablet (25 mg total) by mouth daily. 12/21/12   Clanford Cyndie Mull, MD  hydrochlorothiazide (HYDRODIURIL) 25 MG tablet Take 1 tablet (25 mg total) by mouth daily. 06/30/13   Ripudeep Jenna Luo, MD  metoprolol (LOPRESSOR) 50 MG tablet Take 1 tablet (50 mg total) by mouth 2 (two) times daily. 12/21/12   Clanford Cyndie Mull, MD  metoprolol (LOPRESSOR) 50 MG tablet Take 1 tablet (50 mg total) by mouth 2 (two) times daily. 06/30/13   Ripudeep Jenna Luo,  MD  naproxen (NAPROSYN) 500 MG tablet Take 1 tablet (500 mg total) by mouth 2 (two) times daily between meals as needed (left shoulder pain). 06/30/13   Ripudeep Jenna Luo, MD  traMADol (ULTRAM) 50 MG tablet Take 1 tablet (50 mg total) by mouth every 6 (six) hours as needed for pain. 06/30/13   Ripudeep Jenna Luo, MD    REVIEW OF SYSTEMS:  Constitutional:   No   Fevers, chills, fatigue.  HEENT:    No headaches, Sore throat,   Cardio-vascular: No chest pain,  Orthopnea, swelling in lower extremities, anasarca, palpitations  GI:  No abdominal pain, nausea, vomiting, diarrhea  Resp: No shortness of breath,  No coughing up of blood.No cough.No wheezing.  Skin:  no rash or lesions.  GU:  no dysuria, change in color of urine, no urgency or frequency.  No flank pain.  Musculoskeletal: No joint pain or swelling.  No decreased range of motion.  No back pain.  Psych: No change in mood or affect. No depression or anxiety.  No memory loss.   Exam  General appearance :Awake, alert, NAD, Speech Clear. HEENT: Atraumatic and Normocephalic, PERLA Neck: supple, no JVD. No cervical lymphadenopathy.  Chest: clear to auscultation bilaterally, no wheezing, rales or rhonchi CVS: S1 S2 regular, no murmurs.  Abdomen: soft, NBS, NT, ND, no gaurding, rigidity or rebound. Extremities: No cyanosis, clubbing, B/L  Lower Ext shows no edema,  Neurology: Awake alert, and oriented X 3, CN II-XII intact, Non focal Skin:No Rash or lesions Wounds: N/A    Data Review   Basic Metabolic Panel: No results found for this basename: NA, K, CL, CO2, GLUCOSE, BUN, CREATININE, CALCIUM, MG, PHOS,  in the last 168 hours Liver Function Tests: No results found for this basename: AST, ALT, ALKPHOS, BILITOT, PROT, ALBUMIN,  in the last 168 hours  CBC: No results found for this basename: WBC, NEUTROABS, HGB, HCT, MCV, PLT,  in the last 168  hours ------------------------------------------------------------------------------------------------------------------ No results found for this basename: HGBA1C,  in the last 72 hours ------------------------------------------------------------------------------------------------------------------ No results found for this basename: CHOL, HDL, LDLCALC, TRIG, CHOLHDL, LDLDIRECT,  in the last 72 hours ------------------------------------------------------------------------------------------------------------------ No results found for this basename: TSH, T4TOTAL, FREET3, T3FREE, THYROIDAB,  in the last 72 hours ------------------------------------------------------------------------------------------------------------------ No results found for this basename: VITAMINB12, FOLATE, FERRITIN, TIBC, IRON, RETICCTPCT,  in the last 72 hours  Coagulation profile  No results found for this basename: INR, PROTIME,  in the last 168 hours    Assessment & Plan   Active Problems: Hypertension - Refills atenolol and HCTZ  History of prediabetes: -Counseled patient on diet and weight control, exercise   - Will check hemoglobin A1c at next visit  - Previous hemoglobin A1c 5.7 in March 2014  History of anemia - Last labs in March 2014 hemoglobin of 8.5, patient states periods usually last 5-7 days  - Continue iron supplementation   Health screening :  -She declined flu shot  -  Ambulatory referral for OB-GYN for Pap smear , last Pap smear was 2 years - Screening mammogram ordered, per patient had never done mammogram   Recommendations:CBC, BMET, hemoglobin A1c, lipid panel ordered for next visit   Follow-up in  3 months    RAI,RIPUDEEP M.D. 06/30/2013, 10:20 AM

## 2013-07-08 ENCOUNTER — Encounter: Payer: Self-pay | Admitting: Obstetrics & Gynecology

## 2013-07-15 ENCOUNTER — Ambulatory Visit
Admission: RE | Admit: 2013-07-15 | Discharge: 2013-07-15 | Disposition: A | Discharge status: Home / Self Care | Payer: No Typology Code available for payment source | Source: Ambulatory Visit | Attending: Internal Medicine | Admitting: Internal Medicine

## 2013-07-15 DIAGNOSIS — I10 Essential (primary) hypertension: Secondary | ICD-10-CM

## 2013-07-15 DIAGNOSIS — R7303 Prediabetes: Secondary | ICD-10-CM

## 2013-07-21 ENCOUNTER — Other Ambulatory Visit: Payer: Self-pay | Admitting: Internal Medicine

## 2013-07-21 DIAGNOSIS — R928 Other abnormal and inconclusive findings on diagnostic imaging of breast: Secondary | ICD-10-CM

## 2013-08-04 ENCOUNTER — Ambulatory Visit
Admission: RE | Admit: 2013-08-04 | Discharge: 2013-08-04 | Disposition: A | Discharge status: Home / Self Care | Payer: No Typology Code available for payment source | Source: Ambulatory Visit | Attending: Internal Medicine | Admitting: Internal Medicine

## 2013-08-04 DIAGNOSIS — R928 Other abnormal and inconclusive findings on diagnostic imaging of breast: Secondary | ICD-10-CM

## 2013-08-05 ENCOUNTER — Other Ambulatory Visit: Payer: Self-pay

## 2013-08-19 ENCOUNTER — Encounter: Payer: No Typology Code available for payment source | Admitting: Obstetrics & Gynecology

## 2013-08-25 ENCOUNTER — Telehealth: Payer: Self-pay | Admitting: Internal Medicine

## 2013-08-25 ENCOUNTER — Other Ambulatory Visit: Payer: Self-pay | Admitting: Internal Medicine

## 2013-08-25 ENCOUNTER — Other Ambulatory Visit: Payer: Self-pay | Admitting: Emergency Medicine

## 2013-08-25 MED ORDER — TRAMADOL HCL 50 MG PO TABS: 50.0000 mg | ORAL_TABLET | Freq: Four times a day (QID) | ORAL | Status: DC | PRN

## 2013-08-25 NOTE — Telephone Encounter (Signed)
Pt's pharmacy called in today to see if she can get script refill for traMADol (ULTRAM) 50 MG tabletl;

## 2013-09-30 ENCOUNTER — Ambulatory Visit: Payer: No Typology Code available for payment source | Admitting: Family Medicine

## 2013-10-07 ENCOUNTER — Ambulatory Visit: Payer: No Typology Code available for payment source

## 2013-12-15 ENCOUNTER — Ambulatory Visit: Payer: No Typology Code available for payment source | Attending: Internal Medicine

## 2014-01-18 ENCOUNTER — Telehealth: Payer: Self-pay | Admitting: Internal Medicine

## 2014-01-18 NOTE — Telephone Encounter (Signed)
Pt came into the office regarding a refill of her blood pressure medication, the next available appointment will be for 6/16 pt is schedule for that day. Pt would like a refill of her medication until she get to see the doctor in June. Please contact pt

## 2014-01-24 ENCOUNTER — Other Ambulatory Visit: Payer: Self-pay | Admitting: Internal Medicine

## 2014-01-24 DIAGNOSIS — I10 Essential (primary) hypertension: Secondary | ICD-10-CM

## 2014-03-15 ENCOUNTER — Ambulatory Visit: Payer: No Typology Code available for payment source | Attending: Internal Medicine | Admitting: Internal Medicine

## 2014-03-15 ENCOUNTER — Encounter: Payer: Self-pay | Admitting: Internal Medicine

## 2014-03-15 VITALS — BP 160/90 | HR 77 | Temp 98.0°F | Resp 16 | Wt 191.8 lb

## 2014-03-15 DIAGNOSIS — R7309 Other abnormal glucose: Secondary | ICD-10-CM | POA: Insufficient documentation

## 2014-03-15 DIAGNOSIS — Z79899 Other long term (current) drug therapy: Secondary | ICD-10-CM | POA: Insufficient documentation

## 2014-03-15 DIAGNOSIS — D649 Anemia, unspecified: Secondary | ICD-10-CM | POA: Insufficient documentation

## 2014-03-15 DIAGNOSIS — R7303 Prediabetes: Secondary | ICD-10-CM

## 2014-03-15 DIAGNOSIS — E559 Vitamin D deficiency, unspecified: Secondary | ICD-10-CM

## 2014-03-15 DIAGNOSIS — I10 Essential (primary) hypertension: Secondary | ICD-10-CM

## 2014-03-15 DIAGNOSIS — Z139 Encounter for screening, unspecified: Secondary | ICD-10-CM

## 2014-03-15 LAB — CBC WITH DIFFERENTIAL/PLATELET
BASOS PCT: 1 % (ref 0–1)
Basophils Absolute: 0.1 10*3/uL (ref 0.0–0.1)
EOS ABS: 0.2 10*3/uL (ref 0.0–0.7)
EOS PCT: 3 % (ref 0–5)
HCT: 30.4 % — ABNORMAL LOW (ref 36.0–46.0)
HEMOGLOBIN: 9.5 g/dL — AB (ref 12.0–15.0)
LYMPHS ABS: 1.7 10*3/uL (ref 0.7–4.0)
Lymphocytes Relative: 31 % (ref 12–46)
MCH: 24.5 pg — AB (ref 26.0–34.0)
MCHC: 31.3 g/dL (ref 30.0–36.0)
MCV: 78.6 fL (ref 78.0–100.0)
MONOS PCT: 11 % (ref 3–12)
Monocytes Absolute: 0.6 10*3/uL (ref 0.1–1.0)
Neutro Abs: 3 10*3/uL (ref 1.7–7.7)
Neutrophils Relative %: 54 % (ref 43–77)
PLATELETS: 352 10*3/uL (ref 150–400)
RBC: 3.87 MIL/uL (ref 3.87–5.11)
RDW: 19.8 % — ABNORMAL HIGH (ref 11.5–15.5)
WBC: 5.6 10*3/uL (ref 4.0–10.5)

## 2014-03-15 LAB — COMPLETE METABOLIC PANEL WITH GFR
ALBUMIN: 3.7 g/dL (ref 3.5–5.2)
ALT: 20 U/L (ref 0–35)
AST: 16 U/L (ref 0–37)
Alkaline Phosphatase: 59 U/L (ref 39–117)
BILIRUBIN TOTAL: 0.3 mg/dL (ref 0.2–1.2)
BUN: 13 mg/dL (ref 6–23)
CO2: 26 meq/L (ref 19–32)
Calcium: 9.2 mg/dL (ref 8.4–10.5)
Chloride: 103 mEq/L (ref 96–112)
Creat: 0.71 mg/dL (ref 0.50–1.10)
GFR, Est Non African American: >89 mL/min
GLUCOSE: 106 mg/dL — AB (ref 70–99)
POTASSIUM: 3.7 meq/L (ref 3.5–5.3)
SODIUM: 135 meq/L (ref 135–145)
TOTAL PROTEIN: 7 g/dL (ref 6.0–8.3)

## 2014-03-15 MED ORDER — METOPROLOL TARTRATE 50 MG PO TABS: 50.0000 mg | ORAL_TABLET | Freq: Two times a day (BID) | ORAL | Status: DC

## 2014-03-15 MED ORDER — FERROUS SULFATE 325 (65 FE) MG PO TABS: 325.0000 mg | ORAL_TABLET | Freq: Every day | ORAL | Status: DC

## 2014-03-15 MED ORDER — HYDROCHLOROTHIAZIDE 25 MG PO TABS: ORAL_TABLET | ORAL | Status: DC

## 2014-03-15 NOTE — Patient Instructions (Addendum)
Diabetes Meal Planning Guide The diabetes meal planning guide is a tool to help you plan your meals and snacks. It is important for people with diabetes to manage their blood glucose (sugar) levels. Choosing the right foods and the right amounts throughout your day will help control your blood glucose. Eating right can even help you improve your blood pressure and reach or maintain a healthy weight. CARBOHYDRATE COUNTING MADE EASY When you eat carbohydrates, they turn to sugar. This raises your blood glucose level. Counting carbohydrates can help you control this level so you feel better. When you plan your meals by counting carbohydrates, you can have more flexibility in what you eat and balance your medicine with your food intake. Carbohydrate counting simply means adding up the total amount of carbohydrate grams in your meals and snacks. Try to eat about the same amount at each meal. Foods with carbohydrates are listed below. Each portion below is 1 carbohydrate serving or 15 grams of carbohydrates. Ask your dietician how many grams of carbohydrates you should eat at each meal or snack. Grains and Starches  1 slice bread.   English muffin or hotdog/hamburger bun.   cup cold cereal (unsweetened).   cup cooked pasta or rice.   cup starchy vegetables (corn, potatoes, peas, beans, winter squash).  1 tortilla (6 inches).   bagel.  1 waffle or pancake (size of a CD).   cup cooked cereal.  4 to 6 small crackers. *Whole grain is recommended. Fruit  1 cup fresh unsweetened berries, melon, papaya, pineapple.  1 small fresh fruit.   banana or mango.   cup fruit juice (4 oz unsweetened).   cup canned fruit in natural juice or water.  2 tbs dried fruit.  12 to 15 grapes or cherries. Milk and Yogurt  1 cup fat-free or 1% milk.  1 cup soy milk.  6 oz light yogurt with sugar-free sweetener.  6 oz low-fat soy yogurt.  6 oz plain yogurt. Vegetables  1 cup raw or  cup  cooked is counted as 0 carbohydrates or a "free" food.  If you eat 3 or more servings at 1 meal, count them as 1 carbohydrate serving. Other Carbohydrates   oz chips or pretzels.   cup ice cream or frozen yogurt.   cup sherbet or sorbet.  2 inch square cake, no frosting.  1 tbs honey, sugar, jam, jelly, or syrup.  2 small cookies.  3 squares of graham crackers.  3 cups popcorn.  6 crackers.  1 cup broth-based soup.  Count 1 cup casserole or other mixed foods as 2 carbohydrate servings.  Foods with less than 20 calories in a serving may be counted as 0 carbohydrates or a "free" food. You may want to purchase a book or computer software that lists the carbohydrate gram counts of different foods. In addition, the nutrition facts panel on the labels of the foods you eat are a good source of this information. The label will tell you how big the serving size is and the total number of carbohydrate grams you will be eating per serving. Divide this number by 15 to obtain the number of carbohydrate servings in a portion. Remember, 1 carbohydrate serving equals 15 grams of carbohydrate. SERVING SIZES Measuring foods and serving sizes helps you make sure you are getting the right amount of food. The list below tells how big or small some common serving sizes are.  1 oz.........4 stacked dice.  3 oz.........Deck of cards.  1 tsp........Tip   of little finger.  1 tbs........Thumb.  2 tbs........Golf ball.   cup.......Half of a fist.  1 cup........A fist. SAMPLE DIABETES MEAL PLAN Below is a sample meal plan that includes foods from the grain and starches, dairy, vegetable, fruit, and meat groups. A dietician can individualize a meal plan to fit your calorie needs and tell you the number of servings needed from each food group. However, controlling the total amount of carbohydrates in your meal or snack is more important than making sure you include all of the food groups at every  meal. You may interchange carbohydrate containing foods (dairy, starches, and fruits). The meal plan below is an example of a 2000 calorie diet using carbohydrate counting. This meal plan has 17 carbohydrate servings. Breakfast  1 cup oatmeal (2 carb servings).   cup light yogurt (1 carb serving).  1 cup blueberries (1 carb serving).   cup almonds. Snack  1 large apple (2 carb servings).  1 low-fat string cheese stick. Lunch  Chicken breast salad.  1 cup spinach.   cup chopped tomatoes.  2 oz chicken breast, sliced.  2 tbs low-fat Italian dressing.  12 whole-wheat crackers (2 carb servings).  12 to 15 grapes (1 carb serving).  1 cup low-fat milk (1 carb serving). Snack  1 cup carrots.   cup hummus (1 carb serving). Dinner  3 oz broiled salmon.  1 cup brown rice (3 carb servings). Snack  1  cups steamed broccoli (1 carb serving) drizzled with 1 tsp olive oil and lemon juice.  1 cup light pudding (2 carb servings). DIABETES MEAL PLANNING WORKSHEET Your dietician can use this worksheet to help you decide how many servings of foods and what types of foods are right for you.  BREAKFAST Food Group and Servings / Carb Servings Grain/Starches __________________________________ Dairy __________________________________________ Vegetable ______________________________________ Fruit ___________________________________________ Meat __________________________________________ Fat ____________________________________________ LUNCH Food Group and Servings / Carb Servings Grain/Starches ___________________________________ Dairy ___________________________________________ Fruit ____________________________________________ Meat ___________________________________________ Fat _____________________________________________ DINNER Food Group and Servings / Carb Servings Grain/Starches ___________________________________ Dairy  ___________________________________________ Fruit ____________________________________________ Meat ___________________________________________ Fat _____________________________________________ SNACKS Food Group and Servings / Carb Servings Grain/Starches ___________________________________ Dairy ___________________________________________ Vegetable _______________________________________ Fruit ____________________________________________ Meat ___________________________________________ Fat _____________________________________________ DAILY TOTALS Starches _________________________ Vegetable ________________________ Fruit ____________________________ Dairy ____________________________ Meat ____________________________ Fat ______________________________ Document Released: 06/13/2005 Document Revised: 12/09/2011 Document Reviewed: 04/24/2009 [employer] Patient Information 2014 [employer] DASH Diet The DASH diet stands for "Dietary Approaches to Stop Hypertension." It is a healthy eating plan that has been shown to reduce high blood pressure (hypertension) in as little as 14 days, while also possibly providing other significant health benefits. These other health benefits include reducing the risk of breast cancer after menopause and reducing the risk of type 2 diabetes, heart disease, colon cancer, and stroke. Health benefits also include weight loss and slowing kidney failure in patients with chronic kidney disease.  DIET GUIDELINES  Limit salt (sodium). Your diet should contain less than 1500 mg of sodium daily.  Limit refined or processed carbohydrates. Your diet should include mostly whole grains. Desserts and added sugars should be used sparingly.  Include small amounts of heart-healthy fats. These types of fats include nuts, oils, and tub margarine. Limit saturated and trans fats. These fats have been shown to be harmful in the body. CHOOSING FOODS  The following food groups  are based on a 2000 calorie diet. See your Registered Dietitian for individual calorie needs. Grains and Grain Products (6 to 8 servings daily)  Eat More Often:   Whole-wheat bread, brown rice, whole-grain or wheat pasta, quinoa, popcorn without added fat or salt (air popped).  Eat Less Often: White bread, white pasta, white rice, cornbread. Vegetables (4 to 5 servings daily)  Eat More Often: Fresh, frozen, and canned vegetables. Vegetables may be raw, steamed, roasted, or grilled with a minimal amount of fat.  Eat Less Often/Avoid: Creamed or fried vegetables. Vegetables in a cheese sauce. Fruit (4 to 5 servings daily)  Eat More Often: All fresh, canned (in natural juice), or frozen fruits. Dried fruits without added sugar. One hundred percent fruit juice ( cup [237 mL] daily).  Eat Less Often: Dried fruits with added sugar. Canned fruit in light or heavy syrup. Lean Meats, Fish, and Poultry (2 servings or less daily. One serving is 3 to 4 oz [85-114 g]).  Eat More Often: Ninety percent or leaner ground beef, tenderloin, sirloin. Round cuts of beef, chicken breast, turkey breast. All fish. Grill, bake, or broil your meat. Nothing should be fried.  Eat Less Often/Avoid: Fatty cuts of meat, turkey, or chicken leg, thigh, or wing. Fried cuts of meat or fish. Dairy (2 to 3 servings)  Eat More Often: Low-fat or fat-free milk, low-fat plain or light yogurt, reduced-fat or part-skim cheese.  Eat Less Often/Avoid: Milk (whole, 2%).Whole milk yogurt. Full-fat cheeses. Nuts, Seeds, and Legumes (4 to 5 servings per week)  Eat More Often: All without added salt.  Eat Less Often/Avoid: Salted nuts and seeds, canned beans with added salt. Fats and Sweets (limited)  Eat More Often: Vegetable oils, tub margarines without trans fats, sugar-free gelatin. Mayonnaise and salad dressings.  Eat Less Often/Avoid: Coconut oils, palm oils, butter, stick margarine, cream, half and half, cookies, candy,  pie. FOR MORE INFORMATION The Dash Diet Eating Plan: www.dashdiet.org Document Released: 09/05/2011 Document Revised: 12/09/2011 Document Reviewed: 09/05/2011 [employer] Patient Information 2014 [employer].  

## 2014-03-15 NOTE — Progress Notes (Signed)
Patient here for follow up on her HTN And to get her medications refilled

## 2014-03-15 NOTE — Progress Notes (Signed)
MRN: [id number] Name: Abigail Gonzalez  Sex: female Age: 48 y.o. DOB: 06-27-1966  Allergies: Review of patient's allergies indicates no known allergies.  Chief Complaint  Patient presents with  . Follow-up    HPI: Patient is 48 y.o. female who has history of hypertension, prediabetes, anemia vitamin D deficiency comes today for followup and requesting refill on her medications, today her blood pressure is elevated, she denies any headache dizziness chest and shortness of breath, her manual blood pressure is 160/90, as per patient she has not been taking metoprolol twice a day as prescribed, she only takes in the morning and also taking hydrochlorothiazide 25 mg, I have counseled patient to take medication regularly as prescribed, she also history of menorrhagia/anemia, takes iron supplement but not regularly.  Past Medical History  Diagnosis Date  . Hypertension     Past Surgical History  Procedure Laterality Date  . No past surgeries        Medication List       This list is accurate as of: 03/15/14  4:00 PM.  Always use your most recent med list.               ferrous sulfate 325 (65 FE) MG tablet  Take 1 tablet (325 mg total) by mouth daily with breakfast.     hydrochlorothiazide 25 MG tablet  Commonly known as:  HYDRODIURIL  Take 1 tablet (25 mg total) by mouth daily.     hydrochlorothiazide 25 MG tablet  Commonly known as:  HYDRODIURIL  TAKE 1 TABLET BY MOUTH ONCE DAILY     metoprolol 50 MG tablet  Commonly known as:  LOPRESSOR  Take 1 tablet (50 mg total) by mouth 2 (two) times daily.     metoprolol 50 MG tablet  Commonly known as:  LOPRESSOR  Take 1 tablet (50 mg total) by mouth 2 (two) times daily.     naproxen 500 MG tablet  Commonly known as:  NAPROSYN  Take 1 tablet (500 mg total) by mouth 2 (two) times daily between meals as needed (left shoulder pain).     traMADol 50 MG tablet  Commonly known as:  ULTRAM  Take 1 tablet (50 mg total) by  mouth every 6 (six) hours as needed.     Vitamin D 2000 UNITS tablet  Take 1 tablet (2,000 Units total) by mouth daily.        Meds ordered this encounter  Medications  . ferrous sulfate 325 (65 FE) MG tablet    Sig: Take 1 tablet (325 mg total) by mouth daily with breakfast.    Dispense:  30 tablet    Refill:  4  . hydrochlorothiazide (HYDRODIURIL) 25 MG tablet    Sig: TAKE 1 TABLET BY MOUTH ONCE DAILY    Dispense:  30 tablet    Refill:  3  . metoprolol (LOPRESSOR) 50 MG tablet    Sig: Take 1 tablet (50 mg total) by mouth 2 (two) times daily.    Dispense:  60 tablet    Refill:  3     There is no immunization history on file for this patient.  History reviewed. No pertinent family history.  History  Substance Use Topics  . Smoking status: Never Smoker   . Smokeless tobacco: Not on file  . Alcohol Use: No    Review of Systems   As noted in HPI  Filed Vitals:   03/15/14 1558  BP: 160/90  Pulse:   Temp:  Resp:     Physical Exam  Physical Exam  Constitutional: No distress.  Eyes: EOM are normal. Pupils are equal, round, and reactive to light.  Neck: Neck supple.  Cardiovascular: Normal rate and regular rhythm.   Pulmonary/Chest: Breath sounds normal. No respiratory distress. She has no wheezes. She has no rales.  Musculoskeletal: She exhibits no edema.    CBC    Component Value Date/Time   WBC 5.6 12/21/2012 1107   RBC 3.81* 12/21/2012 1107   HGB 8.5* 12/21/2012 1107   HCT 27.6* 12/21/2012 1107   PLT PLATELET CLUMPING, SUGGEST RECOLLECTION OF SAMPLE IN CITRATE TUBE. 12/21/2012 1107   MCV 72.4* 12/21/2012 1107   LYMPHSABS 2.1 04/05/2010 0017   MONOABS 0.7 04/05/2010 0017   EOSABS 0.3 04/05/2010 0017   BASOSABS 0.0 04/05/2010 0017    CMP     Component Value Date/Time   NA 138 12/21/2012 1107   K 3.5 12/21/2012 1107   CL 101 12/21/2012 1107   CO2 27 12/21/2012 1107   GLUCOSE 113* 12/21/2012 1107   BUN 12 12/21/2012 1107   CREATININE 0.57 12/21/2012 1107    CALCIUM 9.2 12/21/2012 1107   PROT 7.8 12/21/2012 1107   ALBUMIN 3.6 12/21/2012 1107   AST 19 12/21/2012 1107   ALT 18 12/21/2012 1107   ALKPHOS 74 12/21/2012 1107   BILITOT 0.3 12/21/2012 1107   GFRNONAA >90 12/21/2012 1107   GFRAA >90 12/21/2012 1107    Lab Results  Component Value Date/Time   CHOL 176 12/21/2012 11:07 AM    No components found with this basename: hga1c    Lab Results  Component Value Date/Time   AST 19 12/21/2012 11:07 AM    Assessment and Plan  HTN (hypertension) - Plan: I Have advised patient for DASH diet will continue with hydrochlorothiazide 25 mg, I have advised patient to take metoprolol twice a day.  hydrochlorothiazide (HYDRODIURIL) 25 MG tablet, metoprolol (LOPRESSOR) 50 MG tablet, COMPLETE METABOLIC PANEL WITH GFR  Anemia - Plan: Continue with her ferrous sulfate 325 (65 FE) MG tablet, will repeat CBC with Differential  Prediabetes - Plan: I have advised patient for low carb diet , will repeat Hemoglobin A1c  Vitamin D insufficiency - Plan: Vit D  25 hydroxy (rtn osteoporosis monitoring)  Screening - Plan: Ambulatory referral to Gynecology   Health Maintenance  -Pap Smear: Referred to GYN -Mammogram: Up-to-date   Return in about 3 months (around 06/15/2014) for hypertension.  Lorayne Marek, MD

## 2014-03-16 LAB — VITAMIN D 25 HYDROXY (VIT D DEFICIENCY, FRACTURES): Vit D, 25-Hydroxy: 29 ng/mL — ABNORMAL LOW (ref 30–89)

## 2014-03-16 LAB — HEMOGLOBIN A1C
Hgb A1c MFr Bld: 5.1 % (ref <5.7)
MEAN PLASMA GLUCOSE: 100 mg/dL (ref <117)

## 2014-03-24 ENCOUNTER — Telehealth: Payer: Self-pay

## 2014-03-24 NOTE — Telephone Encounter (Signed)
Message copied by Dorothe Pea on Thu Mar 24, 2014 11:10 AM ------      Message from: Lorayne Marek      Created: Thu Mar 24, 2014  9:35 AM       Blood work reviewed, hemoglobin level is improving, advise patient to take iron supplement 3 times a day. ------

## 2014-03-24 NOTE — Telephone Encounter (Signed)
Patient not available Left message on voice mail to return our call 

## 2014-03-24 NOTE — Telephone Encounter (Signed)
Patient showed up at office Lab results given

## 2014-04-07 ENCOUNTER — Ambulatory Visit: Payer: No Typology Code available for payment source

## 2014-04-28 ENCOUNTER — Ambulatory Visit: Payer: No Typology Code available for payment source

## 2014-05-05 ENCOUNTER — Ambulatory Visit: Payer: No Typology Code available for payment source

## 2014-05-19 ENCOUNTER — Other Ambulatory Visit (HOSPITAL_COMMUNITY)
Admission: RE | Admit: 2014-05-19 | Discharge: 2014-05-19 | Disposition: A | Discharge status: Home / Self Care | Payer: No Typology Code available for payment source | Source: Ambulatory Visit | Attending: Family Medicine | Admitting: Family Medicine

## 2014-05-19 ENCOUNTER — Ambulatory Visit (INDEPENDENT_AMBULATORY_CARE_PROVIDER_SITE_OTHER): Payer: No Typology Code available for payment source | Admitting: Family Medicine

## 2014-05-19 VITALS — BP 157/91 | HR 77 | Temp 98.1°F | Wt 190.5 lb

## 2014-05-19 DIAGNOSIS — Z124 Encounter for screening for malignant neoplasm of cervix: Secondary | ICD-10-CM

## 2014-05-19 DIAGNOSIS — Z1151 Encounter for screening for human papillomavirus (HPV): Secondary | ICD-10-CM | POA: Insufficient documentation

## 2014-05-19 DIAGNOSIS — Z01419 Encounter for gynecological examination (general) (routine) without abnormal findings: Secondary | ICD-10-CM | POA: Insufficient documentation

## 2014-05-19 NOTE — Patient Instructions (Signed)
You had a pap test done today. This is a normal screening procedure. Your results will be available in about a week. It is normal to have spotting for a couple days. If you experience heavy bleeding, foul smelling discharge, or fevers or chills, please seek care. You can follow up with your regular doctor as scheduled. If the results are negative, you will have a repeat pap test in 5 years, however if they are abnormal you may have to come back for further testing.  Your blood pressure was also elevated during today's visit. You can talk to your regylar doctor about managing your high blood pressure.

## 2014-05-19 NOTE — Progress Notes (Signed)
Patient ID: Abigail Gonzalez, female   DOB: [birth year], 48 y.o.   MRN: [id number] Subjective:     Abigail Gonzalez is a 48 y.o. woman who comes in today for a  pap smear only. Her most recent annual exam was on 03/15/2014. Her most recent Pap smear was a few years ago and reportedly showed no abnormalities.  Previous abnormal Pap smears: no.  Contraception: none  The following portions of the patient's history were reviewed and updated as appropriate: allergies, current medications, past family history, past medical history, past social history, past surgical history and problem list.  Review of Systems A comprehensive review of systems was negative.   Objective:    BP 157/91  Pulse 77  Temp(Src) 98.1 F (36.7 C) (Oral)  Wt 190 lb 8 oz (86.41 kg)  LMP 04/23/2014  Physical Exam  Constitutional: She appears well-developed and well-nourished. No distress.  Cardiovascular: Normal rate and regular rhythm.   Pulmonary/Chest: Effort normal and breath sounds normal.  Abdominal: Soft. She exhibits no distension. There is no tenderness.  Skin: Skin is warm and dry.   Pelvic Exam: cervix normal in appearance, external genitalia normal, no adnexal masses or tenderness, no cervical motion tenderness, uterus normal size, shape, and consistency and vagina normal without discharge. Pap smear obtained.   Assessment:    Screening pap smear with HPV co-test.   Plan:    Follow up in 5 years for repeat screening , or as indicated by Pap results.

## 2014-05-20 LAB — CYTOLOGY - PAP

## 2014-05-23 ENCOUNTER — Telehealth: Payer: Self-pay | Admitting: Family Medicine

## 2014-05-23 NOTE — Telephone Encounter (Signed)
Normal PAP test result discussed with patient.

## 2014-07-27 ENCOUNTER — Ambulatory Visit: Payer: Self-pay | Attending: Internal Medicine

## 2014-09-26 ENCOUNTER — Other Ambulatory Visit: Payer: Self-pay

## 2014-10-07 ENCOUNTER — Other Ambulatory Visit: Payer: Self-pay | Admitting: Internal Medicine

## 2014-10-14 ENCOUNTER — Other Ambulatory Visit: Payer: Self-pay | Admitting: Internal Medicine

## 2014-10-17 ENCOUNTER — Ambulatory Visit: Payer: Self-pay | Attending: Internal Medicine | Admitting: Internal Medicine

## 2014-10-17 ENCOUNTER — Encounter: Payer: Self-pay | Admitting: Internal Medicine

## 2014-10-17 VITALS — BP 154/89 | HR 73 | Temp 98.0°F | Resp 16 | Ht 63.0 in | Wt 192.0 lb

## 2014-10-17 DIAGNOSIS — R7309 Other abnormal glucose: Secondary | ICD-10-CM | POA: Insufficient documentation

## 2014-10-17 DIAGNOSIS — I10 Essential (primary) hypertension: Secondary | ICD-10-CM | POA: Insufficient documentation

## 2014-10-17 DIAGNOSIS — D509 Iron deficiency anemia, unspecified: Secondary | ICD-10-CM | POA: Insufficient documentation

## 2014-10-17 DIAGNOSIS — R7303 Prediabetes: Secondary | ICD-10-CM

## 2014-10-17 DIAGNOSIS — D508 Other iron deficiency anemias: Secondary | ICD-10-CM

## 2014-10-17 DIAGNOSIS — Z79899 Other long term (current) drug therapy: Secondary | ICD-10-CM | POA: Insufficient documentation

## 2014-10-17 LAB — POCT GLYCOSYLATED HEMOGLOBIN (HGB A1C): Hemoglobin A1C: 4.8

## 2014-10-17 LAB — GLUCOSE, POCT (MANUAL RESULT ENTRY): POC Glucose: 132 mg/dl — AB (ref 70–99)

## 2014-10-17 MED ORDER — METOPROLOL TARTRATE 50 MG PO TABS: 50.0000 mg | ORAL_TABLET | Freq: Two times a day (BID) | ORAL | Status: DC

## 2014-10-17 MED ORDER — HYDROCHLOROTHIAZIDE 25 MG PO TABS: 25.0000 mg | ORAL_TABLET | Freq: Every day | ORAL | Status: DC

## 2014-10-17 MED ORDER — FERROUS SULFATE 325 (65 FE) MG PO TABS: 325.0000 mg | ORAL_TABLET | Freq: Every day | ORAL | Status: DC

## 2014-10-17 NOTE — Progress Notes (Signed)
Pt is here following up on her HTN. Pt states that she has been taking her medications everyday.  Pt needs her medications refilled.

## 2014-10-17 NOTE — Patient Instructions (Signed)
DASH Eating Plan DASH stands for "Dietary Approaches to Stop Hypertension." The DASH eating plan is a healthy eating plan that has been shown to reduce high blood pressure (hypertension). Additional health benefits may include reducing the risk of type 2 diabetes mellitus, heart disease, and stroke. The DASH eating plan may also help with weight loss. WHAT DO I NEED TO KNOW ABOUT THE DASH EATING PLAN? For the DASH eating plan, you will follow these general guidelines:  Choose foods with a percent daily value for sodium of less than 5% (as listed on the food label).  Use salt-free seasonings or herbs instead of table salt or sea salt.  Check with your health care provider or pharmacist before using salt substitutes.  Eat lower-sodium products, often labeled as "lower sodium" or "no salt added."  Eat fresh foods.  Eat more vegetables, fruits, and low-fat dairy products.  Choose whole grains. Look for the word "whole" as the first word in the ingredient list.  Choose fish and skinless chicken or turkey more often than red meat. Limit fish, poultry, and meat to 6 oz (170 g) each day.  Limit sweets, desserts, sugars, and sugary drinks.  Choose heart-healthy fats.  Limit cheese to 1 oz (28 g) per day.  Eat more home-cooked food and less restaurant, buffet, and fast food.  Limit fried foods.  Cook foods using methods other than frying.  Limit canned vegetables. If you do use them, rinse them well to decrease the sodium.  When eating at a restaurant, ask that your food be prepared with less salt, or no salt if possible. WHAT FOODS CAN I EAT? Seek help from a dietitian for individual calorie needs. Grains Whole grain or whole wheat bread. Brown rice. Whole grain or whole wheat pasta. Quinoa, bulgur, and whole grain cereals. Low-sodium cereals. Corn or whole wheat flour tortillas. Whole grain cornbread. Whole grain crackers. Low-sodium crackers. Vegetables Fresh or frozen vegetables  (raw, steamed, roasted, or grilled). Low-sodium or reduced-sodium tomato and vegetable juices. Low-sodium or reduced-sodium tomato sauce and paste. Low-sodium or reduced-sodium canned vegetables.  Fruits All fresh, canned (in natural juice), or frozen fruits. Meat and Other Protein Products Ground beef (85% or leaner), grass-fed beef, or beef trimmed of fat. Skinless chicken or turkey. Ground chicken or turkey. Pork trimmed of fat. All fish and seafood. Eggs. Dried beans, peas, or lentils. Unsalted nuts and seeds. Unsalted canned beans. Dairy Low-fat dairy products, such as skim or 1% milk, 2% or reduced-fat cheeses, low-fat ricotta or cottage cheese, or plain low-fat yogurt. Low-sodium or reduced-sodium cheeses. Fats and Oils Tub margarines without trans fats. Light or reduced-fat mayonnaise and salad dressings (reduced sodium). Avocado. Safflower, olive, or canola oils. Natural peanut or almond butter. Other Unsalted popcorn and pretzels. The items listed above may not be a complete list of recommended foods or beverages. Contact your dietitian for more options. WHAT FOODS ARE NOT RECOMMENDED? Grains White bread. White pasta. White rice. Refined cornbread. Bagels and croissants. Crackers that contain trans fat. Vegetables Creamed or fried vegetables. Vegetables in a cheese sauce. Regular canned vegetables. Regular canned tomato sauce and paste. Regular tomato and vegetable juices. Fruits Dried fruits. Canned fruit in light or heavy syrup. Fruit juice. Meat and Other Protein Products Fatty cuts of meat. Ribs, chicken wings, bacon, sausage, bologna, salami, chitterlings, fatback, hot dogs, bratwurst, and packaged luncheon meats. Salted nuts and seeds. Canned beans with salt. Dairy Whole or 2% milk, cream, half-and-half, and cream cheese. Whole-fat or sweetened yogurt. Full-fat   cheeses or blue cheese. Nondairy creamers and whipped toppings. Processed cheese, cheese spreads, or cheese  curds. Condiments Onion and garlic salt, seasoned salt, table salt, and sea salt. Canned and packaged gravies. Worcestershire sauce. Tartar sauce. Barbecue sauce. Teriyaki sauce. Soy sauce, including reduced sodium. Steak sauce. Fish sauce. Oyster sauce. Cocktail sauce. Horseradish. Ketchup and mustard. Meat flavorings and tenderizers. Bouillon cubes. Hot sauce. Tabasco sauce. Marinades. Taco seasonings. Relishes. Fats and Oils Butter, stick margarine, lard, shortening, ghee, and bacon fat. Coconut, palm kernel, or palm oils. Regular salad dressings. Other Pickles and olives. Salted popcorn and pretzels. The items listed above may not be a complete list of foods and beverages to avoid. Contact your dietitian for more information. WHERE CAN I FIND MORE INFORMATION? National Heart, Lung, and Blood Institute: www.nhlbi.nih.gov/health/health-topics/topics/dash/ Document Released: 09/05/2011 Document Revised: 01/31/2014 Document Reviewed: 07/21/2013 ExitCare Patient Information 2015 [employer]. This information is not intended to replace advice given to you by your health care provider. Make sure you discuss any questions you have with your health care provider. Hypertension Hypertension, commonly called high blood pressure, is when the force of blood pumping through your arteries is too strong. Your arteries are the blood vessels that carry blood from your heart throughout your body. A blood pressure reading consists of a higher number over a lower number, such as 110/72. The higher number (systolic) is the pressure inside your arteries when your heart pumps. The lower number (diastolic) is the pressure inside your arteries when your heart relaxes. Ideally you want your blood pressure below 120/80. Hypertension forces your heart to work harder to pump blood. Your arteries may become narrow or stiff. Having hypertension puts you at risk for heart disease, stroke, and other problems.  RISK  FACTORS Some risk factors for high blood pressure are controllable. Others are not.  Risk factors you cannot control include:   Race. You may be at higher risk if you are African American.  Age. Risk increases with age.  Gender. Men are at higher risk than women before age 45 years. After age 65, women are at higher risk than men. Risk factors you can control include:  Not getting enough exercise or physical activity.  Being overweight.  Getting too much fat, sugar, calories, or salt in your diet.  Drinking too much alcohol. SIGNS AND SYMPTOMS Hypertension does not usually cause signs or symptoms. Extremely high blood pressure (hypertensive crisis) may cause headache, anxiety, shortness of breath, and nosebleed. DIAGNOSIS  To check if you have hypertension, your health care provider will measure your blood pressure while you are seated, with your arm held at the level of your heart. It should be measured at least twice using the same arm. Certain conditions can cause a difference in blood pressure between your right and left arms. A blood pressure reading that is higher than normal on one occasion does not mean that you need treatment. If one blood pressure reading is high, ask your health care provider about having it checked again. TREATMENT  Treating high blood pressure includes making lifestyle changes and possibly taking medicine. Living a healthy lifestyle can help lower high blood pressure. You may need to change some of your habits. Lifestyle changes may include:  Following the DASH diet. This diet is high in fruits, vegetables, and whole grains. It is low in salt, red meat, and added sugars.  Getting at least 2 hours of brisk physical activity every week.  Losing weight if necessary.  Not smoking.  Limiting   alcoholic beverages.  Learning ways to reduce stress. If lifestyle changes are not enough to get your blood pressure under control, your health care provider may  prescribe medicine. You may need to take more than one. Work closely with your health care provider to understand the risks and benefits. HOME CARE INSTRUCTIONS  Have your blood pressure rechecked as directed by your health care provider.   Take medicines only as directed by your health care provider. Follow the directions carefully. Blood pressure medicines must be taken as prescribed. The medicine does not work as well when you skip doses. Skipping doses also puts you at risk for problems.   Do not smoke.   Monitor your blood pressure at home as directed by your health care provider. SEEK MEDICAL CARE IF:   You think you are having a reaction to medicines taken.  You have recurrent headaches or feel dizzy.  You have swelling in your ankles.  You have trouble with your vision. SEEK IMMEDIATE MEDICAL CARE IF:  You develop a severe headache or confusion.  You have unusual weakness, numbness, or feel faint.  You have severe chest or abdominal pain.  You vomit repeatedly.  You have trouble breathing. MAKE SURE YOU:   Understand these instructions.  Will watch your condition.  Will get help right away if you are not doing well or get worse. Document Released: 09/16/2005 Document Revised: 01/31/2014 Document Reviewed: 07/09/2013 ExitCare Patient Information 2015 [employer]. This information is not intended to replace advice given to you by your health care provider. Make sure you discuss any questions you have with your health care provider.  

## 2014-10-17 NOTE — Progress Notes (Signed)
Patient ID: Abigail Gonzalez, female   DOB: August 25, 1966, 49 y.o.   MRN: [id number]   Abigail Gonzalez, is a 49 y.o. female  [id number]  [id number]  DOB - March 24, 1966  Chief Complaint  Patient presents with  . Follow-up        Subjective:   Abigail Gonzalez is a 49 y.o. female here today for a follow up visit. Pleasant woman with history of hypertension on hydrochlorothiazide and metoprolol here today for routine follow-up. She has not been seen for over 6 months, she ran out of her medications and here for refill. She has no complaints today. She claims compliant with medication when she has them. She does not smoke cigarettes, she does not drink alcohol. She also has history of hypovitaminosis D and iron deficiency anemia, she is on over-the-counter vitamin D supplements and prescription ferrous sulfate. She is asymptomatic. She is up-to-date in her preventive medical care. Patient has No headache, No chest pain, No abdominal pain - No Nausea, No new weakness tingling or numbness, No Cough - SOB.  Problem  Essential Hypertension    ALLERGIES: No Known Allergies  PAST MEDICAL HISTORY: Past Medical History  Diagnosis Date  . Hypertension     MEDICATIONS AT HOME: Prior to Admission medications   Medication Sig Start Date End Date Taking? Authorizing Provider  Cholecalciferol (VITAMIN D) 2000 UNITS tablet Take 1 tablet (2,000 Units total) by mouth daily. 06/30/13  Yes Ripudeep Krystal Eaton, MD  ferrous sulfate 325 (65 FE) MG tablet Take 1 tablet (325 mg total) by mouth daily with breakfast. 10/17/14  Yes Tresa Garter, MD  hydrochlorothiazide (HYDRODIURIL) 25 MG tablet Take 1 tablet (25 mg total) by mouth daily. 10/17/14  Yes Tresa Garter, MD  metoprolol (LOPRESSOR) 50 MG tablet Take 1 tablet (50 mg total) by mouth 2 (two) times daily. 10/17/14  Yes Tresa Garter, MD  naproxen (NAPROSYN) 500 MG tablet Take 1 tablet (500 mg total) by mouth 2 (two) times daily between  meals as needed (left shoulder pain). Patient not taking: Reported on 10/17/2014 06/30/13   Ripudeep Krystal Eaton, MD  traMADol (ULTRAM) 50 MG tablet Take 1 tablet (50 mg total) by mouth every 6 (six) hours as needed. Patient not taking: Reported on 10/17/2014 08/25/13   Tresa Garter, MD     Objective:   Filed Vitals:   10/17/14 0948  BP: 154/89  Pulse: 73  Temp: 98 F (36.7 C)  TempSrc: Oral  Resp: 16  Height: 5\' 3"  (1.6 m)  Weight: 192 lb (87.091 kg)  SpO2: 100%    Exam General appearance : Awake, alert, not in any distress. Speech Clear. Not toxic looking HEENT: Atraumatic and Normocephalic, pupils equally reactive to light and accomodation Neck: supple, no JVD. No cervical lymphadenopathy.  Chest:Good air entry bilaterally, no added sounds  CVS: S1 S2 regular, no murmurs.  Abdomen: Bowel sounds present, Non tender and not distended with no gaurding, rigidity or rebound. Extremities: B/L Lower Ext shows no edema, both legs are warm to touch Neurology: Awake alert, and oriented X 3, CN II-XII intact, Non focal Skin:No Rash Wounds:N/A  Data Review Lab Results  Component Value Date   HGBA1C 4.80 10/17/2014   HGBA1C 5.1 03/15/2014   HGBA1C 5.7* 12/21/2012     Assessment & Plan   1. Prediabetes  - Glucose (CBG) - HgB A1c  2. Essential hypertension - We discussed blood pressure goals - Patient is to keep ambulatory blood pressure log and bring  log to clinic next time - DASH diet emphasized Refill - metoprolol (LOPRESSOR) 50 MG tablet; Take 1 tablet (50 mg total) by mouth 2 (two) times daily.  Dispense: 180 tablet; Refill: 3 - hydrochlorothiazide (HYDRODIURIL) 25 MG tablet; Take 1 tablet (25 mg total) by mouth daily.  Dispense: 90 tablet; Refill: 3  3. Other iron deficiency anemias  - ferrous sulfate 325 (65 FE) MG tablet; Take 1 tablet (325 mg total) by mouth daily with breakfast.  Dispense: 90 tablet; Refill: 3   Patient was counseled extensively about  nutrition and exercise.   Return in about 3 months (around 01/16/2015), or if symptoms worsen or fail to improve, for Follow up HTN, Annual Physical.  The patient was given clear instructions to go to ER or return to medical center if symptoms don't improve, worsen or new problems develop. The patient verbalized understanding. The patient was told to call to get lab results if they haven't heard anything in the next week.   This note has been created with Surveyor, quantity. Any transcriptional errors are unintentional.    Angelica Chessman, MD, [place], [place], [place] and [facility] [place], [place]   10/17/2014, 10:22 AM

## 2015-02-28 ENCOUNTER — Other Ambulatory Visit: Payer: Self-pay | Admitting: Internal Medicine

## 2015-06-22 ENCOUNTER — Inpatient Hospital Stay (HOSPITAL_COMMUNITY)
Admission: AD | Admit: 2015-06-22 | Discharge: 2015-06-23 | Disposition: A | Discharge status: Home / Self Care | Payer: Self-pay | Source: Ambulatory Visit | Attending: Obstetrics and Gynecology | Admitting: Obstetrics and Gynecology

## 2015-06-22 ENCOUNTER — Encounter (HOSPITAL_COMMUNITY): Payer: Self-pay | Admitting: *Deleted

## 2015-06-22 ENCOUNTER — Inpatient Hospital Stay (HOSPITAL_COMMUNITY): Payer: Self-pay

## 2015-06-22 DIAGNOSIS — N939 Abnormal uterine and vaginal bleeding, unspecified: Secondary | ICD-10-CM | POA: Insufficient documentation

## 2015-06-22 LAB — WET PREP, GENITAL
Clue Cells Wet Prep HPF POC: NONE SEEN
Trich, Wet Prep: NONE SEEN
Yeast Wet Prep HPF POC: NONE SEEN

## 2015-06-22 LAB — CBC
HEMATOCRIT: 22.9 % — AB (ref 36.0–46.0)
HEMOGLOBIN: 6.8 g/dL — AB (ref 12.0–15.0)
MCH: 23.5 pg — ABNORMAL LOW (ref 26.0–34.0)
MCHC: 29.7 g/dL — ABNORMAL LOW (ref 30.0–36.0)
MCV: 79.2 fL (ref 78.0–100.0)
Platelets: 446 10*3/uL — ABNORMAL HIGH (ref 150–400)
RBC: 2.89 MIL/uL — AB (ref 3.87–5.11)
RDW: 19 % — ABNORMAL HIGH (ref 11.5–15.5)
WBC: 8 10*3/uL (ref 4.0–10.5)

## 2015-06-22 LAB — URINE MICROSCOPIC-ADD ON

## 2015-06-22 LAB — URINALYSIS, ROUTINE W REFLEX MICROSCOPIC
Bilirubin Urine: NEGATIVE
Glucose, UA: NEGATIVE mg/dL
KETONES UR: NEGATIVE mg/dL
LEUKOCYTES UA: NEGATIVE
NITRITE: NEGATIVE
PH: 6 (ref 5.0–8.0)
Protein, ur: NEGATIVE mg/dL
SPECIFIC GRAVITY, URINE: 1.02 (ref 1.005–1.030)
Urobilinogen, UA: 0.2 mg/dL (ref 0.0–1.0)

## 2015-06-22 LAB — POCT PREGNANCY, URINE: PREG TEST UR: NEGATIVE

## 2015-06-22 MED ORDER — FERROUS SULFATE 325 (65 FE) MG PO TABS: 325.0000 mg | ORAL_TABLET | Freq: Three times a day (TID) | ORAL | Status: DC

## 2015-06-22 MED ORDER — MEGESTROL ACETATE 40 MG PO TABS: 40.0000 mg | ORAL_TABLET | Freq: Two times a day (BID) | ORAL | Status: DC

## 2015-06-22 NOTE — MAU Provider Note (Signed)
History     CSN: [id number]  Arrival date and time: 06/22/15 9563   First Provider Initiated Contact with Patient 06/22/15 2225      No chief complaint on file.  HPI Comments: Abigail Gonzalez is a 49 y.o. [id number] who presents today with vaginal bleeding. She states that she has been  bleeding since 06/08/15. She states that her periods are usually "normal and regular". She has not had a problem like this in the the past. She does report a history of anemia. She was prescribed iron, but states that she does not take it on a regular basis. She states that she thinks her iron level last year was "around 8".   Vaginal Bleeding The patient's primary symptoms include vaginal bleeding. This is a new problem. Episode onset: 06/08/15. The problem occurs constantly. The problem has been gradually worsening. The patient is experiencing no pain. She is not pregnant. Pertinent negatives include no abdominal pain, constipation, diarrhea, dysuria, fever, frequency, nausea, urgency or vomiting. The vaginal discharge was bloody. The vaginal bleeding is heavier than menses. She has been passing clots (when she uses the bathroom, about the size of a golfball. ). She has not been passing tissue. Nothing aggravates the symptoms. She has tried nothing for the symptoms.     Past Medical History  Diagnosis Date  . Hypertension     Past Surgical History  Procedure Laterality Date  . No past surgeries      No family history on file.  Social History  Substance Use Topics  . Smoking status: Never Smoker   . Smokeless tobacco: None  . Alcohol Use: No    Allergies: No Known Allergies  Prescriptions prior to admission  Medication Sig Dispense Refill Last Dose  . ferrous sulfate 325 (65 FE) MG tablet TAKE 1 TABLET BY MOUTH DAILY WITH BREAKFAST. 30 tablet 4 Past Month at Unknown time  . hydrochlorothiazide (HYDRODIURIL) 25 MG tablet Take 1 tablet (25 mg total) by mouth daily. 90 tablet 3 06/22/2015 at 1200   . metoprolol (LOPRESSOR) 50 MG tablet Take 1 tablet (50 mg total) by mouth 2 (two) times daily. 180 tablet 3 06/22/2015 at 1200  . Cholecalciferol (VITAMIN D) 2000 UNITS tablet Take 1 tablet (2,000 Units total) by mouth daily. (Patient not taking: Reported on 06/22/2015) 30 tablet 4 Not Taking at Unknown time  . [DISCONTINUED] naproxen (NAPROSYN) 500 MG tablet Take 1 tablet (500 mg total) by mouth 2 (two) times daily between meals as needed (left shoulder pain). (Patient not taking: Reported on 10/17/2014) 60 tablet 4 Completed Course at Unknown time  . [DISCONTINUED] traMADol (ULTRAM) 50 MG tablet Take 1 tablet (50 mg total) by mouth every 6 (six) hours as needed. (Patient not taking: Reported on 10/17/2014) 60 tablet 3 Completed Course at Unknown time    Review of Systems  Constitutional: Negative for fever.  Respiratory: Negative for shortness of breath.   Cardiovascular: Negative for chest pain.  Gastrointestinal: Negative for nausea, vomiting, abdominal pain, diarrhea and constipation.  Genitourinary: Positive for vaginal bleeding. Negative for dysuria, urgency and frequency.  Neurological: Negative for dizziness and weakness.   Physical Exam   Blood pressure 153/70, pulse 91, temperature 98.1 F (36.7 C), temperature source Oral, resp. rate 20, height 5\' 2"  (1.575 m), weight 81.421 kg (179 lb 8 oz), last menstrual period 06/08/2015.  Physical Exam  Nursing note and vitals reviewed. Constitutional: She is oriented to person, place, and time. She appears well-developed and well-nourished. No distress.  Cardiovascular: Normal rate.   Respiratory: Effort normal.  GI: Soft. There is no tenderness. There is no rebound.  Genitourinary:  External: no lesion Vagina: small amount of blood seen  Cervix: pink, smooth, no CMT Uterus: NSSC Adnexa: NT   Neurological: She is alert and oriented to person, place, and time.  Skin: Skin is warm and dry.  Psychiatric: She has a normal mood and  affect.   Results for orders placed or performed during the hospital encounter of 06/22/15 (from the past 24 hour(s))  CBC     Status: Abnormal   Collection Time: 06/22/15  8:00 PM  Result Value Ref Range   WBC 8.0 4.0 - 10.5 K/uL   RBC 2.89 (L) 3.87 - 5.11 MIL/uL   Hemoglobin 6.8 (LL) 12.0 - 15.0 g/dL   HCT 22.9 (L) 36.0 - 46.0 %   MCV 79.2 78.0 - 100.0 fL   MCH 23.5 (L) 26.0 - 34.0 pg   MCHC 29.7 (L) 30.0 - 36.0 g/dL   RDW 19.0 (H) 11.5 - 15.5 %   Platelets 446 (H) 150 - 400 K/uL  Urinalysis, Routine w reflex microscopic (not at [facility])     Status: Abnormal   Collection Time: 06/22/15  8:29 PM  Result Value Ref Range   Color, Urine YELLOW YELLOW   APPearance CLEAR CLEAR   Specific Gravity, Urine 1.020 1.005 - 1.030   pH 6.0 5.0 - 8.0   Glucose, UA NEGATIVE NEGATIVE mg/dL   Hgb urine dipstick LARGE (A) NEGATIVE   Bilirubin Urine NEGATIVE NEGATIVE   Ketones, ur NEGATIVE NEGATIVE mg/dL   Protein, ur NEGATIVE NEGATIVE mg/dL   Urobilinogen, UA 0.2 0.0 - 1.0 mg/dL   Nitrite NEGATIVE NEGATIVE   Leukocytes, UA NEGATIVE NEGATIVE  Urine microscopic-add on     Status: None   Collection Time: 06/22/15  8:29 PM  Result Value Ref Range   Squamous Epithelial / LPF RARE RARE   RBC / HPF 11-20 <3 RBC/hpf   Bacteria, UA RARE RARE  Pregnancy, urine POC     Status: None   Collection Time: 06/22/15  8:44 PM  Result Value Ref Range   Preg Test, Ur NEGATIVE NEGATIVE   US Transvaginal Non-ob  06/22/2015   CLINICAL DATA:  Heavy vaginal bleeding with clots. Hemoglobin 6.8. LMP 06/08/2015. Premenopausal.  EXAM: TRANSABDOMINAL AND TRANSVAGINAL ULTRASOUND OF PELVIS  TECHNIQUE: Both transabdominal and transvaginal ultrasound examinations of the pelvis were performed. Transabdominal technique was performed for global imaging of the pelvis including uterus, ovaries, adnexal regions, and pelvic cul-de-sac. It was necessary to proceed with endovaginal exam following the transabdominal exam to visualize the  endometrium and ovaries.  COMPARISON:  None  FINDINGS: Uterus  Measurements: 9.9 x 5.6 x 7.5 cm. No fibroids or other mass visualized.  Endometrium  Thickness: 20 mm.  Heterogeneous appearance of the endometrium  Right ovary  Measurements: 2.4 x 1.5 x 2.3 cm. Normal appearance/no adnexal mass.  Left ovary  Measurements: 2.3 x 1.6 x 1.5 cm. Normal appearance/no adnexal mass.  Other findings  No free fluid.  IMPRESSION: 1. Heterogeneous thickened endometrium. The appearance raises a question of adenomyomatosis. 2. If bleeding remains unresponsive to hormonal or medical therapy, focal lesion work-up with sonohysterogram should be considered. Endometrial biopsy should also be considered in pre-menopausal patients at high risk for endometrial carcinoma. (Ref: Radiological Reasoning: Algorithmic Workup of Abnormal Vaginal Bleeding with Endovaginal Sonography and Sonohysterography. AJR 2008; 761:Y07-37) 3. Normal appearance of both ovaries.   Electronically Signed  By: Nolon Nations M.D.   On: 06/22/2015 23:40   US Pelvis Complete  06/22/2015   CLINICAL DATA:  Heavy vaginal bleeding with clots. Hemoglobin 6.8. LMP 06/08/2015. Premenopausal.  EXAM: TRANSABDOMINAL AND TRANSVAGINAL ULTRASOUND OF PELVIS  TECHNIQUE: Both transabdominal and transvaginal ultrasound examinations of the pelvis were performed. Transabdominal technique was performed for global imaging of the pelvis including uterus, ovaries, adnexal regions, and pelvic cul-de-sac. It was necessary to proceed with endovaginal exam following the transabdominal exam to visualize the endometrium and ovaries.  COMPARISON:  None  FINDINGS: Uterus  Measurements: 9.9 x 5.6 x 7.5 cm. No fibroids or other mass visualized.  Endometrium  Thickness: 20 mm.  Heterogeneous appearance of the endometrium  Right ovary  Measurements: 2.4 x 1.5 x 2.3 cm. Normal appearance/no adnexal mass.  Left ovary  Measurements: 2.3 x 1.6 x 1.5 cm. Normal appearance/no adnexal mass.  Other  findings  No free fluid.  IMPRESSION: 1. Heterogeneous thickened endometrium. The appearance raises a question of adenomyomatosis. 2. If bleeding remains unresponsive to hormonal or medical therapy, focal lesion work-up with sonohysterogram should be considered. Endometrial biopsy should also be considered in pre-menopausal patients at high risk for endometrial carcinoma. (Ref: Radiological Reasoning: Algorithmic Workup of Abnormal Vaginal Bleeding with Endovaginal Sonography and Sonohysterography. AJR 2008; 569:V94-80) 3. Normal appearance of both ovaries.   Electronically Signed   By: Nolon Nations M.D.   On: 06/22/2015 23:40    MAU Course  Procedures  MDM   Assessment and Plan   1. Episode of heavy vaginal bleeding   2. Abnormal uterine bleeding    DC home Comfort measures reviewed  Bleeding precautions RX: megace BID #60, FESO4 TID #90 Return to MAU as needed Patient is anemic today, but has been anemic. She is also asymptomatic at this time. Will send home with iron and megace.   Follow-up Information    Follow up with [facility].   Specialty:  Obstetrics and Gynecology   Why:  they will call you with an appointment    Contact information:   [place] [phone]      Mathis Bud 06/22/2015, 10:26 PM

## 2015-06-22 NOTE — MAU Note (Signed)
PT SAYS HER LMP  WAS 9-8.     WHEN STARTED   WAS LIGHT -   THEN BECAME  HEAVY     WITH  CLOTS.    WAS GOING  TO [facility] BUT  SWITCHED  .   HAD PAP SMEAR   AT  [place] -   09-2014.     ALL NL.   NOW IN TRIAGE -   ON PAD    SMALL AMT  LIGHT  PINK.       NO CRAMPS

## 2015-06-22 NOTE — MAU Note (Signed)
CRITICAL VALUE ALERT  Critical value received: Hgb 6.8   Date of notification:  06/22/2015  Time of notification:  2029  Critical value read back: yes  Nurse who received alert:  Maryagnes Amos RN  MD notified (1st page):  Carlton Adam CNM  Time of first page:  2029

## 2015-06-22 NOTE — Discharge Instructions (Signed)
Abnormal Uterine Bleeding Abnormal uterine bleeding can affect women at various stages in life, including teenagers, women in their reproductive years, pregnant women, and women who have reached menopause. Several kinds of uterine bleeding are considered abnormal, including:  Bleeding or spotting between periods.   Bleeding after sexual intercourse.   Bleeding that is heavier or more than normal.   Periods that last longer than usual.  Bleeding after menopause.  Many cases of abnormal uterine bleeding are minor and simple to treat, while others are more serious. Any type of abnormal bleeding should be evaluated by your health care provider. Treatment will depend on the cause of the bleeding. HOME CARE INSTRUCTIONS Monitor your condition for any changes. The following actions may help to alleviate any discomfort you are experiencing:  Avoid the use of tampons and douches as directed by your health care provider.  Change your pads frequently. You should get regular pelvic exams and Pap tests. Keep all follow-up appointments for diagnostic tests as directed by your health care provider.  SEEK MEDICAL CARE IF:   Your bleeding lasts more than 1 week.   You feel dizzy at times.  SEEK IMMEDIATE MEDICAL CARE IF:   You pass out.   You are changing pads every 15 to 30 minutes.   You have abdominal pain.  You have a fever.   You become sweaty or weak.   You are passing large blood clots from the vagina.   You start to feel nauseous and vomit. MAKE SURE YOU:   Understand these instructions.  Will watch your condition.  Will get help right away if you are not doing well or get worse. Document Released: 09/16/2005 Document Revised: 09/21/2013 Document Reviewed: 04/15/2013 ExitCare Patient Information 2015 [employer]. This information is not intended to replace advice given to you by your health care provider. Make sure you discuss any questions you have with your  health care provider. Endometrial Biopsy Endometrial biopsy is a procedure in which a tissue sample is taken from inside the uterus. The tissue sample is then looked at under a microscope to see if the tissue is normal or abnormal. The endometrium is the lining of the uterus. This procedure helps determine where you are in your menstrual cycle and how hormone levels are affecting the lining of the uterus. This procedure may also be used to evaluate uterine bleeding or to diagnose endometrial cancer, tuberculosis, polyps, or inflammatory conditions.  LET YOUR HEALTH CARE PROVIDER KNOW ABOUT:  Any allergies you have.  All medicines you are taking, including vitamins, herbs, eye drops, creams, and over-the-counter medicines.  Previous problems you or members of your family have had with the use of anesthetics.  Any blood disorders you have.  Previous surgeries you have had.  Medical conditions you have.  Possibility of pregnancy. RISKS AND COMPLICATIONS Generally, this is a safe procedure. However, as with any procedure, complications can occur. Possible complications include:  Bleeding.  Pelvic infection.  Puncture of the uterine wall with the biopsy device (rare). BEFORE THE PROCEDURE   Keep a record of your menstrual cycles as directed by your health care provider. You may need to schedule your procedure for a specific time in your cycle.  You may want to bring a sanitary pad to wear home after the procedure.  Arrange for someone to drive you home after the procedure if you will be given a medicine to help you relax (sedative). PROCEDURE   You may be given a sedative to relax   you.  You will lie on an exam table with your feet and legs supported as in a pelvic exam.  Your health care provider will insert an instrument (speculum) into your vagina to see your cervix.  Your cervix will be cleansed with an antiseptic solution. A medicine (local anesthetic) will be used to numb  the cervix.  A forceps instrument (tenaculum) will be used to hold your cervix steady for the biopsy.  A thin, rodlike instrument (uterine sound) will be inserted through your cervix to determine the length of your uterus and the location where the biopsy sample will be removed.  A thin, flexible tube (catheter) will be inserted through your cervix and into the uterus. The catheter is used to collect the biopsy sample from your endometrial tissue.  The catheter and speculum will then be removed, and the tissue sample will be sent to a lab for examination. AFTER THE PROCEDURE  You will rest in a recovery area until you are ready to go home.  You may have mild cramping and a small amount of vaginal bleeding for a few days after the procedure. This is normal.  Make sure you find out how to get your test results. Document Released: 01/17/2005 Document Revised: 05/19/2013 Document Reviewed: 03/03/2013 ExitCare Patient Information 2015 [employer]. This information is not intended to replace advice given to you by your health care provider. Make sure you discuss any questions you have with your health care provider.  

## 2015-06-23 ENCOUNTER — Other Ambulatory Visit: Payer: Self-pay | Admitting: Obstetrics & Gynecology

## 2015-06-23 LAB — GC/CHLAMYDIA PROBE AMP (~~LOC~~) NOT AT ARMC
Chlamydia: NEGATIVE
Neisseria Gonorrhea: NEGATIVE

## 2015-06-26 ENCOUNTER — Telehealth: Payer: Self-pay | Admitting: *Deleted

## 2015-06-26 NOTE — Telephone Encounter (Signed)
Patient left a message this morning stating she is returning a phone call from hospital or clinic.

## 2015-06-28 NOTE — Telephone Encounter (Signed)
Called patient back, and it appears she goes to [place] and was referred to our clinic for bleeding issues. She missed an appointment for 06/23/15 and I informed her I do not see a note where we called her- but probably we were calling to reschedule where she missed her appointment for bleeding issues. I informed her I would have registrars to call her back tomorrow with another appointment.

## 2015-07-06 ENCOUNTER — Ambulatory Visit: Payer: Self-pay

## 2015-07-12 ENCOUNTER — Ambulatory Visit: Payer: Self-pay | Attending: Internal Medicine

## 2015-07-19 ENCOUNTER — Other Ambulatory Visit: Payer: Self-pay | Admitting: Advanced Practice Midwife

## 2015-07-21 ENCOUNTER — Encounter: Payer: Self-pay | Admitting: Medical

## 2015-07-21 ENCOUNTER — Other Ambulatory Visit (HOSPITAL_COMMUNITY)
Admission: RE | Admit: 2015-07-21 | Discharge: 2015-07-21 | Disposition: A | Discharge status: Home / Self Care | Payer: Self-pay | Source: Ambulatory Visit | Attending: Obstetrics & Gynecology | Admitting: Obstetrics & Gynecology

## 2015-07-21 ENCOUNTER — Ambulatory Visit (INDEPENDENT_AMBULATORY_CARE_PROVIDER_SITE_OTHER): Payer: Self-pay | Admitting: Medical

## 2015-07-21 VITALS — BP 159/87 | HR 99 | Temp 98.3°F | Wt 179.0 lb

## 2015-07-21 DIAGNOSIS — N939 Abnormal uterine and vaginal bleeding, unspecified: Secondary | ICD-10-CM

## 2015-07-21 DIAGNOSIS — Z3202 Encounter for pregnancy test, result negative: Secondary | ICD-10-CM

## 2015-07-21 LAB — POCT PREGNANCY, URINE: PREG TEST UR: NEGATIVE

## 2015-07-21 MED ORDER — MEGESTROL ACETATE 40 MG PO TABS: 40.0000 mg | ORAL_TABLET | Freq: Two times a day (BID) | ORAL | Status: DC

## 2015-07-21 NOTE — Patient Instructions (Signed)

## 2015-07-21 NOTE — Progress Notes (Signed)
Patient ID: Abigail Gonzalez, female   DOB: [birth year], 49 y.o.   MRN: [id number]  History:  Abigail Gonzalez is a 49 y.o. [id number] who presents to clinic today for follow-up from [facility] for irregular bleeding. The patient states a history of regular periods, however the last few months have been prolonged heavy periods lasting 8 days. When she was seen in [facility] and given Rx for Megace which she took and her bleeding stopped. However, when it was time for her next period she noted some spotting and discontinued the Megace at that time. Since then she had spotting x 3-4 days and then has had heavy bleeding since Sunday. She states occasional mild cramping with heavy bleeding, but denies pain now.    The following portions of the patient's history were reviewed and updated as appropriate: allergies, current medications, family history, past medical history, social history, past surgical history and problem list.  Review of Systems:  Other than those mentioned in HPI all ROS negative  Objective:  Physical Exam BP 159/87 mmHg  Pulse 99  Temp(Src) 98.3 F (36.8 C) (Oral)  Wt 179 lb (81.194 kg)  LMP 07/12/2015 CONSTITUTIONAL: Well-developed, well-nourished female in no acute distress.  EYES: EOM intact, conjunctivae normal, no scleral icterus HEAD: Normocephalic, atraumatic ENT: External right and left ear normal, oropharynx is clear and moist. CARDIOVASCULAR: Normal heart rate noted. No cyanosis or edema.  RESPIRATORY: Effort normal, no problems with respiration noted. GASTROINTESTINAL:Soft, no distention noted.  No tenderness, rebound or guarding.  GENITOURINARY: Normal appearing external genitalia; normal appearing vaginal mucosa and cervix.  Small amount of blood in the vaginal vault. MUSCULOSKELETAL: Normal range of motion.  SKIN: Skin is warm and dry. No rash noted. Not diaphoretic. No erythema. No pallor. [place]: Alert and oriented to person, place, and time. Normal muscle tone,  coordination.  PSYCHIATRIC: Normal mood and affect. Normal behavior. Normal judgment and thought content. HEM/LYMPH/IMMUNOLOGIC: Neck supple  Labs and Imaging Results for orders placed or performed in visit on 07/21/15 (from the past 24 hour(s))  Pregnancy, urine POC     Status: None   Collection Time: 07/21/15 11:00 AM  Result Value Ref Range   Preg Test, Ur NEGATIVE NEGATIVE    Procedure - Endometrial Biopsy Patient given informed consent, signed copy in the chart, time out was performed. Appropriate time out taken. . The patient was placed in the lithotomy position and the cervix brought into view with sterile speculum.  Portio of cervix cleansed x 2 with betadine swabs.  A tenaculum was placed in the anterior lip of the cervix.  The uterus was sounded for depth of 7 cm. A pipelle was introduced to into the uterus, suction created,  and an endometrial sample was obtained. The majority of the sample appears to be blood material, unsure if endometrial tissue was obtained. All equipment was removed and accounted for.  The patient tolerated the procedure well.    Patient given post procedure instructions.     Assessment & Plan:  Assessment: Irregular bleeding  Plans: Endometrial biopsy attempted and sample sent to pathology. May need to be repeated at next visit in the presence of less bleeding if sample does not indicate appropriate endometrial sampling was obtained.  Bleeding precautions discussed Rx for Megace sent to patient's pharmacy. Patient advised to restart Megace BID and continue until next appointment Patient also advised to continue Iron supplement Patient to return to [facility] in 2 weeks for results and management or sooner if symptoms were  to change or worsen  Luvenia Redden, PA-C 07/21/2015 11:50 AM

## 2015-07-26 ENCOUNTER — Telehealth: Payer: Self-pay | Admitting: General Practice

## 2015-07-26 NOTE — Telephone Encounter (Signed)
Per Almyra Free, sufficient sample for endo bx was collected and biopsy was negative. Patient should continue megace BID and keep appt with Dr Elly Modena for f/u. Called patient, no answer- left message stating we are trying to reach you with non urgent results, please call us back at the clinics

## 2015-07-27 NOTE — Telephone Encounter (Signed)
Called patient and informed her of results and recommendations. Patient verbalized understanding to all and had no questions 

## 2015-08-11 ENCOUNTER — Ambulatory Visit (INDEPENDENT_AMBULATORY_CARE_PROVIDER_SITE_OTHER): Payer: Self-pay | Admitting: Obstetrics and Gynecology

## 2015-08-11 ENCOUNTER — Encounter: Payer: Self-pay | Admitting: Obstetrics and Gynecology

## 2015-08-11 VITALS — BP 150/78 | HR 89 | Wt 180.3 lb

## 2015-08-11 DIAGNOSIS — N939 Abnormal uterine and vaginal bleeding, unspecified: Secondary | ICD-10-CM

## 2015-08-11 DIAGNOSIS — Z712 Person consulting for explanation of examination or test findings: Secondary | ICD-10-CM

## 2015-08-11 DIAGNOSIS — Z7189 Other specified counseling: Secondary | ICD-10-CM

## 2015-08-11 MED ORDER — MEGESTROL ACETATE 40 MG PO TABS: 40.0000 mg | ORAL_TABLET | Freq: Two times a day (BID) | ORAL | Status: DC

## 2015-08-11 MED ORDER — FERROUS SULFATE 325 (65 FE) MG PO TABS: 325.0000 mg | ORAL_TABLET | Freq: Three times a day (TID) | ORAL | Status: DC

## 2015-08-11 NOTE — Progress Notes (Signed)
Patient ID: Abigail Gonzalez, female   DOB: October 09, 1965, 49 y.o.   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  10/21 Endometrial biopsy Benign endometrium without evidence of hyperplasia or malignancy  A/P 49 yo with DUB associated with perimenopausal state - Results of the biopsy were reviewed and explained - Discussed expectant management,  medical management with Mirena IUD vs surgical management with endometrial ablation - Patient opted to continue Megace for now and will return when ready for further interventions - advised to continue iron supplementation in view of her anemia - RTC prn

## 2015-08-11 NOTE — Addendum Note (Signed)
Addended by: Mora Bellman on: 08/11/2015 09:33 AM   Modules accepted: Orders

## 2015-08-11 NOTE — Patient Instructions (Signed)
Levonorgestrel intrauterine device (IUD) What is this medicine? LEVONORGESTREL IUD (LEE voe nor jes trel) is a contraceptive (birth control) device. The device is placed inside the uterus by a healthcare professional. It is used to prevent pregnancy and can also be used to treat heavy bleeding that occurs during your period. Depending on the device, it can be used for 3 to 5 years. This medicine may be used for other purposes; ask your health care provider or pharmacist if you have questions. What should I tell my health care provider before I take this medicine? They need to know if you have any of these conditions: -abnormal Pap smear -cancer of the breast, uterus, or cervix -diabetes -endometritis -genital or pelvic infection now or in the past -have more than one sexual partner or your partner has more than one partner -heart disease -history of an ectopic or tubal pregnancy -immune system problems -IUD in place -liver disease or tumor -problems with blood clots or take blood-thinners -use intravenous drugs -uterus of unusual shape -vaginal bleeding that has not been explained -an unusual or allergic reaction to levonorgestrel, other hormones, silicone, or polyethylene, medicines, foods, dyes, or preservatives -pregnant or trying to get pregnant -breast-feeding How should I use this medicine? This device is placed inside the uterus by a health care professional. Talk to your pediatrician regarding the use of this medicine in children. Special care may be needed. Overdosage: If you think you have taken too much of this medicine contact a poison control center or emergency room at once. NOTE: This medicine is only for you. Do not share this medicine with others. What if I miss a dose? This does not apply. What may interact with this medicine? Do not take this medicine with any of the following medications: -amprenavir -bosentan -fosamprenavir This medicine may also interact with  the following medications: -aprepitant -barbiturate medicines for inducing sleep or treating seizures -bexarotene -griseofulvin -medicines to treat seizures like carbamazepine, ethotoin, felbamate, oxcarbazepine, phenytoin, topiramate -modafinil -pioglitazone -rifabutin -rifampin -rifapentine -some medicines to treat HIV infection like atazanavir, indinavir, lopinavir, nelfinavir, tipranavir, ritonavir -St. John's wort -warfarin This list may not describe all possible interactions. Give your health care provider a list of all the medicines, herbs, non-prescription drugs, or dietary supplements you use. Also tell them if you smoke, drink alcohol, or use illegal drugs. Some items may interact with your medicine. What should I watch for while using this medicine? Visit your doctor or health care professional for regular check ups. See your doctor if you or your partner has sexual contact with others, becomes HIV positive, or gets a sexual transmitted disease. This product does not protect you against HIV infection (AIDS) or other sexually transmitted diseases. You can check the placement of the IUD yourself by reaching up to the top of your vagina with clean fingers to feel the threads. Do not pull on the threads. It is a good habit to check placement after each menstrual period. Call your doctor right away if you feel more of the IUD than just the threads or if you cannot feel the threads at all. The IUD may come out by itself. You may become pregnant if the device comes out. If you notice that the IUD has come out use a backup birth control method like condoms and call your health care provider. Using tampons will not change the position of the IUD and are okay to use during your period. What side effects may I notice from receiving this medicine?   Side effects that you should report to your doctor or health care professional as soon as possible: -allergic reactions like skin rash, itching or  hives, swelling of the face, lips, or tongue -fever, flu-like symptoms -genital sores -high blood pressure -no menstrual period for 6 weeks during use -pain, swelling, warmth in the leg -pelvic pain or tenderness -severe or sudden headache -signs of pregnancy -stomach cramping -sudden shortness of breath -trouble with balance, talking, or walking -unusual vaginal bleeding, discharge -yellowing of the eyes or skin Side effects that usually do not require medical attention (report to your doctor or health care professional if they continue or are bothersome): -acne -breast pain -change in sex drive or performance -changes in weight -cramping, dizziness, or faintness while the device is being inserted -headache -irregular menstrual bleeding within first 3 to 6 months of use -nausea This list may not describe all possible side effects. Call your doctor for medical advice about side effects. You may report side effects to FDA at 1-800-FDA-1088. Where should I keep my medicine? This does not apply. NOTE: This sheet is a summary. It may not cover all possible information. If you have questions about this medicine, talk to your doctor, pharmacist, or health care provider.    2016, Elsevier/Gold Standard. (2011-10-17 13:54:04)   Endometrial Ablation Endometrial ablation removes the lining of the uterus (endometrium). It is usually a same-day, outpatient treatment. Ablation helps avoid major surgery, such as surgery to remove the cervix and uterus (hysterectomy). After endometrial ablation, you will have little or no menstrual bleeding and may not be able to have children. However, if you are premenopausal, you will need to use a reliable method of birth control following the procedure because of the small chance that pregnancy can occur. There are different reasons to have this procedure. These reasons include:  Heavy periods.  Bleeding that is causing anemia.  Irregular  bleeding.  Bleeding fibroids on the lining inside the uterus if they are smaller than 3 centimeters. This procedure may not be possible for you if:   You want to have children in the future.   You have severe cramps with your menstrual period.   You have precancerous or cancerous cells in your uterus.   You were recently pregnant.   You have gone through menopause.   You have had major surgery on your uterus, resulting in thinning of the uterine wall. Surgeries may include:  The removal of one or more uterine fibroids (myomectomy).  A cesarean section with a classic (vertical) incision on your uterus. Ask your health care provider what type of cesarean you had. Sometimes the scar on your skin is different than the scar on your uterus. Even if you have had surgery on your uterus, certain types of ablation may still be safe for you. Talk with your health care provider. LET [facility] CARE PROVIDER KNOW ABOUT:  Any allergies you have.  All medicines you are taking, including vitamins, herbs, eye drops, creams, and over-the-counter medicines.  Previous problems you or members of your family have had with the use of anesthetics.  Any blood disorders you have.  Previous surgeries you have had.  Medical conditions you have. RISKS AND COMPLICATIONS  Generally, this is a safe procedure. However, as with any procedure, complications can occur. Possible complications include:  Perforation of the uterus.  Bleeding.  Infection of the uterus, bladder, or vagina.  Injury to surrounding organs.  An air bubble to the lung (air embolus).  Pregnancy following  the procedure.  Failure of the procedure to help the problem, requiring hysterectomy.  Decreased ability to diagnose cancer in the lining of the uterus. BEFORE THE PROCEDURE  The lining of the uterus must be tested to make sure there is no pre-cancerous or cancer cells present.  An ultrasound may be performed to look  at the size of the uterus and to check for abnormalities.  Medicines may be given to thin the lining of the uterus. PROCEDURE  During the procedure, your health care provider will use a tool called a resectoscope to help see inside your uterus. There are different ways to remove the lining of your uterus.   Radiofrequency - This method uses a radiofrequency-alternating electric current to remove the lining of the uterus.  Cryotherapy - This method uses extreme cold to freeze the lining of the uterus.  Heated-Free Liquid - This method uses heated salt (saline) solution to remove the lining of the uterus.  Microwave - This method uses high-energy microwaves to heat up the lining of the uterus to remove it.  Thermal balloon - This method involves inserting a catheter with a balloon tip into the uterus. The balloon tip is filled with heated fluid to remove the lining of the uterus. AFTER THE PROCEDURE  After your procedure, do not have sexual intercourse or insert anything into your vagina until permitted by your health care provider. After the procedure, you may experience:  Cramps.  Vaginal discharge.  Frequent urination.   This information is not intended to replace advice given to you by your health care provider. Make sure you discuss any questions you have with your health care provider.   Document Released: 07/26/2004 Document Revised: 06/07/2015 Document Reviewed: 02/17/2013 Elsevier Interactive Patient Education [employer].

## 2015-10-16 MED FILL — MEGESTROL 40 MG TABLET: 40 | 30 days supply | Qty: 60 | Fill #1

## 2015-10-16 MED FILL — HYDROCHLOROTHIAZIDE 25 MG T: 25 | 90 days supply | Qty: 90 | Fill #5

## 2015-11-20 ENCOUNTER — Other Ambulatory Visit: Payer: Self-pay | Admitting: Internal Medicine

## 2015-11-20 MED FILL — MEGESTROL 40 MG TABLET: 40 | 30 days supply | Qty: 60 | Fill #2

## 2015-11-20 MED FILL — METOPROLOL TARTRATE 50 MG T: 50 | 30 days supply | Qty: 60 | Fill #0

## 2015-12-22 ENCOUNTER — Telehealth: Payer: Self-pay | Admitting: Internal Medicine

## 2015-12-22 NOTE — Telephone Encounter (Signed)
Pt. Called requesting a med refill on the following medications:  metoprolol (LOPRESSOR) 50 MG tablet hydrochlorothiazide (HYDRODIURIL) 25 MG tablet   Please f/u with pt.

## 2015-12-25 ENCOUNTER — Other Ambulatory Visit: Payer: Self-pay | Admitting: Internal Medicine

## 2015-12-25 MED FILL — MEGESTROL 40 MG TABLET: 40 | 30 days supply | Qty: 60 | Fill #3

## 2015-12-25 MED FILL — METOPROLOL TARTRATE 50 MG T: 50 | 30 days supply | Qty: 60 | Fill #0

## 2015-12-27 NOTE — Telephone Encounter (Signed)
Medication was refilled on 12/25/15

## 2016-01-15 MED FILL — MEGESTROL 40 MG TABLET: 40 | 30 days supply | Qty: 60 | Fill #1

## 2016-01-15 MED FILL — ?HYDROCHLOROTHIAZIDE 25 MG: 25 MG | 30 days supply | Qty: 30 | Fill #0

## 2016-01-29 MED FILL — ?METOPROLOL 50 MG TABLET: 50 | 30 days supply | Qty: 60 | Fill #1

## 2016-02-01 ENCOUNTER — Ambulatory Visit: Payer: Self-pay | Attending: Internal Medicine | Admitting: Internal Medicine

## 2016-02-01 ENCOUNTER — Encounter: Payer: Self-pay | Admitting: Internal Medicine

## 2016-02-01 VITALS — BP 142/84 | HR 101 | Temp 98.2°F | Resp 18 | Ht 63.0 in | Wt 194.8 lb

## 2016-02-01 DIAGNOSIS — N938 Other specified abnormal uterine and vaginal bleeding: Secondary | ICD-10-CM | POA: Insufficient documentation

## 2016-02-01 DIAGNOSIS — Z79899 Other long term (current) drug therapy: Secondary | ICD-10-CM | POA: Insufficient documentation

## 2016-02-01 DIAGNOSIS — D509 Iron deficiency anemia, unspecified: Secondary | ICD-10-CM | POA: Insufficient documentation

## 2016-02-01 DIAGNOSIS — D500 Iron deficiency anemia secondary to blood loss (chronic): Secondary | ICD-10-CM

## 2016-02-01 DIAGNOSIS — D539 Nutritional anemia, unspecified: Secondary | ICD-10-CM | POA: Insufficient documentation

## 2016-02-01 DIAGNOSIS — I10 Essential (primary) hypertension: Secondary | ICD-10-CM | POA: Insufficient documentation

## 2016-02-01 DIAGNOSIS — D508 Other iron deficiency anemias: Secondary | ICD-10-CM

## 2016-02-01 HISTORY — DX: Iron deficiency anemia secondary to blood loss (chronic): D50.0

## 2016-02-01 LAB — CBC WITH DIFFERENTIAL/PLATELET
BASOS ABS: 57 {cells}/uL (ref 0–200)
Basophils Relative: 1 %
EOS ABS: 171 {cells}/uL (ref 15–500)
Eosinophils Relative: 3 %
HEMATOCRIT: 39.5 % (ref 35.0–45.0)
HEMOGLOBIN: 13.4 g/dL (ref 11.7–15.5)
LYMPHS ABS: 2166 {cells}/uL (ref 850–3900)
LYMPHS PCT: 38 %
MCH: 28.6 pg (ref 27.0–33.0)
MCHC: 33.9 g/dL (ref 32.0–36.0)
MCV: 84.2 fL (ref 80.0–100.0)
MONO ABS: 570 {cells}/uL (ref 200–950)
MPV: 10 fL (ref 7.5–12.5)
Monocytes Relative: 10 %
NEUTROS PCT: 48 %
Neutro Abs: 2736 cells/uL (ref 1500–7800)
Platelets: 191 10*3/uL (ref 140–400)
RBC: 4.69 MIL/uL (ref 3.80–5.10)
RDW: 14 % (ref 11.0–15.0)
WBC: 5.7 10*3/uL (ref 3.8–10.8)

## 2016-02-01 LAB — COMPLETE METABOLIC PANEL WITH GFR
ALBUMIN: 3.9 g/dL (ref 3.6–5.1)
ALK PHOS: 59 U/L (ref 33–115)
ALT: 30 U/L — ABNORMAL HIGH (ref 6–29)
AST: 20 U/L (ref 10–35)
BUN: 14 mg/dL (ref 7–25)
CALCIUM: 9.2 mg/dL (ref 8.6–10.2)
CHLORIDE: 103 mmol/L (ref 98–110)
CO2: 25 mmol/L (ref 20–31)
Creat: 0.58 mg/dL (ref 0.50–1.10)
GFR, Est Non African American: >89 mL/min (ref >=60)
Glucose, Bld: 96 mg/dL (ref 65–99)
POTASSIUM: 3.4 mmol/L — AB (ref 3.5–5.3)
Sodium: 138 mmol/L (ref 135–146)
Total Bilirubin: 0.3 mg/dL (ref 0.2–1.2)
Total Protein: 6.9 g/dL (ref 6.1–8.1)

## 2016-02-01 MED ORDER — HYDROCHLOROTHIAZIDE 25 MG PO TABS: 25.0000 mg | ORAL_TABLET | Freq: Every day | ORAL | Status: DC

## 2016-02-01 MED ORDER — METOPROLOL TARTRATE 50 MG PO TABS: 50.0000 mg | ORAL_TABLET | Freq: Two times a day (BID) | ORAL | Status: DC

## 2016-02-01 MED ORDER — FERROUS SULFATE 325 (65 FE) MG PO TABS: 325.0000 mg | ORAL_TABLET | Freq: Three times a day (TID) | ORAL | Status: DC

## 2016-02-01 NOTE — Progress Notes (Signed)
Patient ID: Abigail Gonzalez, female   DOB: 1966-09-07, 50 y.o.   MRN: [id number]   Abigail Gonzalez, is a 50 y.o. female  [id number]  [id number]  DOB - 08-07-1966  Chief Complaint  Patient presents with  . Medication Refill        Subjective:   Abigail Gonzalez is a 50 y.o. 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 Patient has not taken her medications today because she ran out. She needs refill. She has no complaint. She follows up with gynecologist for her DUB and on iron supplement. Patient has No headache, No chest pain, No abdominal pain - No Nausea, No new weakness tingling or numbness, No Cough - SOB.  Problem  Other Iron Deficiency Anemias    ALLERGIES: No Known Allergies  PAST MEDICAL HISTORY: Past Medical History  Diagnosis Date  . Hypertension     MEDICATIONS AT HOME: Prior to Admission medications   Medication Sig Start Date End Date Taking? Authorizing Provider  ferrous sulfate 325 (65 FE) MG tablet Take 1 tablet (325 mg total) by mouth 3 (three) times daily with meals. 02/01/16  Yes Tresa Garter, MD  hydrochlorothiazide (HYDRODIURIL) 25 MG tablet Take 1 tablet (25 mg total) by mouth daily. 02/01/16  Yes Tresa Garter, MD  megestrol (MEGACE) 40 MG tablet Take 1 tablet (40 mg total) by mouth 2 (two) times daily. 08/11/15  Yes Peggy Constant, MD  metoprolol (LOPRESSOR) 50 MG tablet Take 1 tablet (50 mg total) by mouth 2 (two) times daily. 02/01/16  Yes Tresa Garter, MD     Objective:   Filed Vitals:   02/01/16 1534  BP: 142/84  Pulse: 101  Temp: 98.2 F (36.8 C)  TempSrc: Oral  Resp: 18  Height: 5\' 3"  (1.6 m)  Weight: 194 lb 12.8 oz (88.361 kg)  SpO2: 98%    Exam General appearance : Awake, alert, not in any distress. Speech Clear. Not toxic looking HEENT: Atraumatic and Normocephalic, pupils equally reactive to light and accomodation Neck:  supple, no JVD. No cervical lymphadenopathy.  Chest:Good air entry bilaterally, no added sounds  CVS: S1 S2 regular, no murmurs.  Abdomen: Bowel sounds present, Non tender and not distended with no gaurding, rigidity or rebound. Extremities: B/L Lower Ext shows no edema, both legs are warm to touch Neurology: Awake alert, and oriented X 3, CN II-XII intact, Non focal Skin:No Rash  Data Review Lab Results  Component Value Date   HGBA1C 4.80 10/17/2014   HGBA1C 5.1 03/15/2014   HGBA1C 5.7* 12/21/2012     Assessment & Plan   1. Essential hypertension  - COMPLETE METABOLIC PANEL WITH GFR - TSH - Urinalysis, Complete - metoprolol (LOPRESSOR) 50 MG tablet; Take 1 tablet (50 mg total) by mouth 2 (two) times daily.  Dispense: 180 tablet; Refill: 3 - hydrochlorothiazide (HYDRODIURIL) 25 MG tablet; Take 1 tablet (25 mg total) by mouth daily.  Dispense: 90 tablet; Refill: 3  We have discussed target BP range and blood pressure goal. I have advised patient to check BP regularly and to call us back or report to clinic if the numbers are consistently higher than 140/90. We discussed the importance of compliance with medical therapy and DASH diet recommended, consequences of uncontrolled hypertension discussed.   - continue current BP medications  2. Other iron deficiency anemias  - CBC with Differential/Platelet  - ferrous sulfate 325 (65 FE) MG tablet;  Take 1 tablet (325 mg total) by mouth 3 (three) times daily with meals.  Dispense: 270 tablet; Refill: 3  Patient have been counseled extensively about nutrition and exercise  Return in about 6 months (around 08/03/2016) for Follow up HTN.  The patient was given clear instructions to go to ER or return to medical center if symptoms don't improve, worsen or new problems develop. The patient verbalized understanding. The patient was told to call to get lab results if they haven't heard anything in the next week.   This note has been created  with Surveyor, quantity. Any transcriptional errors are unintentional.    Angelica Chessman, MD, [place], [place], [place], [place] and [place], [place]   02/01/2016, 4:10 PM

## 2016-02-01 NOTE — Patient Instructions (Signed)
DASH Eating Plan °DASH stands for "Dietary Approaches to Stop Hypertension." The DASH eating plan is a healthy eating plan that has been shown to reduce high blood pressure (hypertension). Additional health benefits may include reducing the risk of type 2 diabetes mellitus, heart disease, and stroke. The DASH eating plan may also help with weight loss. °WHAT DO I NEED TO KNOW ABOUT THE DASH EATING PLAN? °For the DASH eating plan, you will follow these general guidelines: °· Choose foods with a percent daily value for sodium of less than 5% (as listed on the food label). °· Use salt-free seasonings or herbs instead of table salt or sea salt. °· Check with your health care provider or pharmacist before using salt substitutes. °· Eat lower-sodium products, often labeled as "lower sodium" or "no salt added." °· Eat fresh foods. °· Eat more vegetables, fruits, and low-fat dairy products. °· Choose whole grains. Look for the word "whole" as the first word in the ingredient list. °· Choose fish and skinless chicken or turkey more often than red meat. Limit fish, poultry, and meat to 6 oz (170 g) each day. °· Limit sweets, desserts, sugars, and sugary drinks. °· Choose heart-healthy fats. °· Limit cheese to 1 oz (28 g) per day. °· Eat more home-cooked food and less restaurant, buffet, and fast food. °· Limit fried foods. °· Cook foods using methods other than frying. °· Limit canned vegetables. If you do use them, rinse them well to decrease the sodium. °· When eating at a restaurant, ask that your food be prepared with less salt, or no salt if possible. °WHAT FOODS CAN I EAT? °Seek help from a dietitian for individual calorie needs. °Grains °Whole grain or whole wheat bread. Brown rice. Whole grain or whole wheat pasta. Quinoa, bulgur, and whole grain cereals. Low-sodium cereals. Corn or whole wheat flour tortillas. Whole grain cornbread. Whole grain crackers. Low-sodium crackers. °Vegetables °Fresh or frozen vegetables  (raw, steamed, roasted, or grilled). Low-sodium or reduced-sodium tomato and vegetable juices. Low-sodium or reduced-sodium tomato sauce and paste. Low-sodium or reduced-sodium canned vegetables.  °Fruits °All fresh, canned (in natural juice), or frozen fruits. °Meat and Other Protein Products °Ground beef (85% or leaner), grass-fed beef, or beef trimmed of fat. Skinless chicken or turkey. Ground chicken or turkey. Pork trimmed of fat. All fish and seafood. Eggs. Dried beans, peas, or lentils. Unsalted nuts and seeds. Unsalted canned beans. °Dairy °Low-fat dairy products, such as skim or 1% milk, 2% or reduced-fat cheeses, low-fat ricotta or cottage cheese, or plain low-fat yogurt. Low-sodium or reduced-sodium cheeses. °Fats and Oils °Tub margarines without trans fats. Light or reduced-fat mayonnaise and salad dressings (reduced sodium). Avocado. Safflower, olive, or canola oils. Natural peanut or almond butter. °Other °Unsalted popcorn and pretzels. °The items listed above may not be a complete list of recommended foods or beverages. Contact your dietitian for more options. °WHAT FOODS ARE NOT RECOMMENDED? °Grains °White bread. White pasta. White rice. Refined cornbread. Bagels and croissants. Crackers that contain trans fat. °Vegetables °Creamed or fried vegetables. Vegetables in a cheese sauce. Regular canned vegetables. Regular canned tomato sauce and paste. Regular tomato and vegetable juices. °Fruits °Dried fruits. Canned fruit in light or heavy syrup. Fruit juice. °Meat and Other Protein Products °Fatty cuts of meat. Ribs, chicken wings, bacon, sausage, bologna, salami, chitterlings, fatback, hot dogs, bratwurst, and packaged luncheon meats. Salted nuts and seeds. Canned beans with salt. °Dairy °Whole or 2% milk, cream, half-and-half, and cream cheese. Whole-fat or sweetened yogurt. Full-fat   cheeses or blue cheese. Nondairy creamers and whipped toppings. Processed cheese, cheese spreads, or cheese  curds. °Condiments °Onion and garlic salt, seasoned salt, table salt, and sea salt. Canned and packaged gravies. Worcestershire sauce. Tartar sauce. Barbecue sauce. Teriyaki sauce. Soy sauce, including reduced sodium. Steak sauce. Fish sauce. Oyster sauce. Cocktail sauce. Horseradish. Ketchup and mustard. Meat flavorings and tenderizers. Bouillon cubes. Hot sauce. Tabasco sauce. Marinades. Taco seasonings. Relishes. °Fats and Oils °Butter, stick margarine, lard, shortening, ghee, and bacon fat. Coconut, palm kernel, or palm oils. Regular salad dressings. °Other °Pickles and olives. Salted popcorn and pretzels. °The items listed above may not be a complete list of foods and beverages to avoid. Contact your dietitian for more information. °WHERE CAN I FIND MORE INFORMATION? °National Heart, [employer], [employer] Institute: www.nhlbi.nih.gov/health/health-topics/topics/dash/ °  °This information is not intended to replace advice given to you by your health care provider. Make sure you discuss any questions you have with your health care provider. °  °Document Released: 09/05/2011 Document Revised: 10/07/2014 Document Reviewed: 07/21/2013 °Elsevier Interactive Patient Education ©2016 Else[employer] Inc. ° °Hypertension °Hypertension, commonly called high blood pressure, is when the force of blood pumping through your arteries is too strong. Your arteries are the blood vessels that carry blood from your heart throughout your body. A blood pressure reading consists of a higher number over a lower number, such as 110/72. The higher number (systolic) is the pressure inside your arteries when your heart pumps. The lower number (diastolic) is the pressure inside your arteries when your heart relaxes. Ideally you want your blood pressure below 120/80. °Hypertension forces your heart to work harder to pump blood. Your arteries may become narrow or stiff. Having untreated or uncontrolled hypertension can cause heart attack, stroke, kidney  disease, and other problems. °RISK FACTORS °Some risk factors for high blood pressure are controllable. Others are not.  °Risk factors you cannot control include:  °· Race. You may be at higher risk if you are African American. °· Age. Risk increases with age. °· Gender. Men are at higher risk than women before age 45 years. After age 65, women are at higher risk than men. °Risk factors you can control include: °· Not getting enough exercise or physical activity. °· Being overweight. °· Getting too much fat, sugar, calories, or salt in your diet. °· Drinking too much alcohol. °SIGNS AND SYMPTOMS °Hypertension does not usually cause signs or symptoms. Extremely high blood pressure (hypertensive crisis) may cause headache, anxiety, shortness of breath, and nosebleed. °DIAGNOSIS °To check if you have hypertension, your health care provider will measure your blood pressure while you are seated, with your arm held at the level of your heart. It should be measured at least twice using the same arm. Certain conditions can cause a difference in blood pressure between your right and left arms. A blood pressure reading that is higher than normal on one occasion does not mean that you need treatment. If it is not clear whether you have high blood pressure, you may be asked to return on a different day to have your blood pressure checked again. Or, you may be asked to monitor your blood pressure at home for 1 or more weeks. °TREATMENT °Treating high blood pressure includes making lifestyle changes and possibly taking medicine. Living a healthy lifestyle can help lower high blood pressure. You may need to change some of your habits. °Lifestyle changes may include: °· Following the DASH diet. This diet is high in fruits, vegetables, and whole   grains. It is low in salt, red meat, and added sugars. °· Keep your sodium intake below 2,300 mg per day. °· Getting at least 30-45 minutes of aerobic exercise at least 4 times per  week. °· Losing weight if necessary. °· Not smoking. °· Limiting alcoholic beverages. °· Learning ways to reduce stress. °Your health care provider may prescribe medicine if lifestyle changes are not enough to get your blood pressure under control, and if one of the following is true: °· You are 18-59 years of age and your systolic blood pressure is above 140. °· You are 60 years of age or older, and your systolic blood pressure is above 150. °· Your diastolic blood pressure is above 90. °· You have diabetes, and your systolic blood pressure is over 140 or your diastolic blood pressure is over 90. °· You have kidney disease and your blood pressure is above 140/90. °· You have heart disease and your blood pressure is above 140/90. °Your personal target blood pressure may vary depending on your medical conditions, your age, and other factors. °HOME CARE INSTRUCTIONS °· Have your blood pressure rechecked as directed by your health care provider.   °· Take medicines only as directed by your health care provider. Follow the directions carefully. Blood pressure medicines must be taken as prescribed. The medicine does not work as well when you skip doses. Skipping doses also puts you at risk for problems. °· Do not smoke.   °· Monitor your blood pressure at home as directed by your health care provider.  °SEEK MEDICAL CARE IF:  °· You think you are having a reaction to medicines taken. °· You have recurrent headaches or feel dizzy. °· You have swelling in your ankles. °· You have trouble with your vision. °SEEK IMMEDIATE MEDICAL CARE IF: °· You develop a severe headache or confusion. °· You have unusual weakness, numbness, or feel faint. °· You have severe chest or abdominal pain. °· You vomit repeatedly. °· You have trouble breathing. °MAKE SURE YOU:  °· Understand these instructions. °· Will watch your condition. °· Will get help right away if you are not doing well or get worse. °  °This information is not intended to  replace advice given to you by your health care provider. Make sure you discuss any questions you have with your health care provider. °  °Document Released: 09/16/2005 Document Revised: 01/31/2015 Document Reviewed: 07/09/2013 °Elsevier Interactive Patient Education ©2016 Elsevier Inc. ° °

## 2016-02-01 NOTE — Progress Notes (Signed)
Patient is here for MED refill  Patient denies pain at this time.  Patient has been out medication for 3 days. Patient refilled Metoprolol. Patient is requesting refills on other 3 medications.  Patient has not taken any medication. Patient has eaten today.

## 2016-02-02 LAB — URINALYSIS, COMPLETE
BACTERIA UA: NONE SEEN [HPF]
BILIRUBIN URINE: NEGATIVE
Casts: NONE SEEN [LPF]
GLUCOSE, UA: NEGATIVE
Ketones, ur: NEGATIVE
LEUKOCYTES UA: NEGATIVE
Nitrite: NEGATIVE
PH: 6 (ref 5.0–8.0)
Protein, ur: NEGATIVE
SPECIFIC GRAVITY, URINE: 1.025 (ref 1.001–1.035)
Yeast: NONE SEEN [HPF]

## 2016-02-02 LAB — TSH: TSH: 4.99 m[IU]/L — AB

## 2016-02-16 MED FILL — ?HYDROCHLOROTHIAZIDE 25 MG: 25 MG | 30 days supply | Qty: 30 | Fill #0

## 2016-02-23 ENCOUNTER — Other Ambulatory Visit: Payer: Self-pay | Admitting: Medical

## 2016-02-27 ENCOUNTER — Other Ambulatory Visit: Payer: Self-pay | Admitting: Medical

## 2016-03-01 ENCOUNTER — Ambulatory Visit: Payer: Self-pay | Attending: Internal Medicine

## 2016-03-05 ENCOUNTER — Telehealth: Payer: Self-pay | Admitting: *Deleted

## 2016-03-05 NOTE — Telephone Encounter (Signed)
-----   Message from Tresa Garter, MD sent at 02/21/2016 12:51 PM EDT ----- Please inform patient that her laboratory results are mostly normal except for thyroid function, we will need to repeat the thyroid function during her next appointment.

## 2016-03-05 NOTE — Telephone Encounter (Signed)
Patient verified DOB Patient is aware of lab results being normal except for thyroid function. Patient is aware of thyroid function being rechecked at 6 month FU visit. No further questions at this time.

## 2016-03-19 ENCOUNTER — Other Ambulatory Visit: Payer: Self-pay | Admitting: Medical

## 2016-03-19 MED FILL — ?METOPROLOL 50 MG TABLET: 50 | 30 days supply | Qty: 60 | Fill #0

## 2016-03-19 MED FILL — HYDROCHLOROTHIAZIDE 25 MG T: 25 | 30 days supply | Qty: 30 | Fill #1

## 2016-06-12 MED FILL — ?METOPROLOL 50 MG TABLET: 50 | 25 days supply | Qty: 50 | Fill #1

## 2016-06-12 MED FILL — HYDROCHLOROTHIAZIDE 25 MG T: 25 | 30 days supply | Qty: 30 | Fill #2

## 2016-08-20 MED FILL — METOPROLOL TARTRATE 50 MG T: 50 | 25 days supply | Qty: 50 | Fill #2

## 2016-08-20 MED FILL — HYDROCHLOROTHIAZIDE 25 MG T: 25 | 30 days supply | Qty: 30 | Fill #3

## 2016-11-04 MED FILL — HYDROCHLOROTHIAZIDE 25 MG T: 25 | 30 days supply | Qty: 30 | Fill #4

## 2017-01-27 MED FILL — HYDROCHLOROTHIAZIDE 25 MG T: 25 | 30 days supply | Qty: 30 | Fill #5

## 2017-01-27 MED FILL — METOPROLOL TARTRATE 50 MG T: 50 | 25 days supply | Qty: 50 | Fill #3

## 2017-02-06 ENCOUNTER — Other Ambulatory Visit: Payer: Self-pay | Admitting: Internal Medicine

## 2017-02-06 ENCOUNTER — Other Ambulatory Visit: Payer: Self-pay | Admitting: Medical

## 2017-02-06 DIAGNOSIS — N939 Abnormal uterine and vaginal bleeding, unspecified: Secondary | ICD-10-CM

## 2017-02-06 DIAGNOSIS — I10 Essential (primary) hypertension: Secondary | ICD-10-CM

## 2017-02-06 MED FILL — METOPROLOL TARTRATE 50 MG T: 50 | 90 days supply | Qty: 180 | Fill #0

## 2017-02-06 MED FILL — HYDROCHLOROTHIAZIDE 25 MG T: 25 | 90 days supply | Qty: 90 | Fill #0

## 2018-03-07 ENCOUNTER — Encounter (HOSPITAL_COMMUNITY): Payer: Self-pay | Admitting: *Deleted

## 2018-03-07 ENCOUNTER — Ambulatory Visit (HOSPITAL_COMMUNITY)
Admission: EM | Admit: 2018-03-07 | Discharge: 2018-03-07 | Disposition: A | Discharge status: Home / Self Care | Payer: Self-pay | Attending: Internal Medicine | Admitting: Internal Medicine

## 2018-03-07 DIAGNOSIS — I10 Essential (primary) hypertension: Secondary | ICD-10-CM

## 2018-03-07 DIAGNOSIS — H65192 Other acute nonsuppurative otitis media, left ear: Secondary | ICD-10-CM

## 2018-03-07 MED ORDER — HYDROCHLOROTHIAZIDE 25 MG PO TABS: 25.0000 mg | ORAL_TABLET | Freq: Every day | ORAL | 0 refills | Status: DC

## 2018-03-07 MED ORDER — CEFDINIR 300 MG PO CAPS: 300.0000 mg | ORAL_CAPSULE | Freq: Two times a day (BID) | ORAL | 0 refills | Status: AC

## 2018-03-07 MED ORDER — METOPROLOL TARTRATE 50 MG PO TABS: 50.0000 mg | ORAL_TABLET | Freq: Two times a day (BID) | ORAL | 0 refills | Status: DC

## 2018-03-07 NOTE — ED Provider Notes (Signed)
[place]    CSN: [id number] Arrival date & time: 03/07/18  1251  History   Chief Complaint Chief Complaint  Patient presents with  . Otalgia   HPI Abigail Gonzalez is a 52 y.o. female.   With history of hypertension, comes in today complaining for left otalgia onset last night. She reports hard of hearing. She has no fever, no tinnitus. Pain is mild to moderate. Denies any URI symptoms.  BP is elevated without any symptoms. Patient reports that she haven't taken of HCTZ for metoprolol for a while. She would like a refill. She does have a PCP.      Past Medical History:  Diagnosis Date  . Hypertension     Patient Active Problem List   Diagnosis Date Noted  . Other iron deficiency anemias 02/01/2016  . Essential hypertension 10/17/2014  . HTN (hypertension) 03/15/2014  . Prediabetes 12/22/2012  . Vitamin D insufficiency 12/22/2012  . Hypertension 12/21/2012  . Anemia 12/21/2012  . Sprain of left shoulder 12/21/2012    Past Surgical History:  Procedure Laterality Date  . NO PAST SURGERIES      OB History    Gravida  4   Para      Term      Preterm      AB  1   Living        SAB  1   TAB      Ectopic      Multiple      Live Births               Home Medications    Prior to Admission medications   Medication Sig Start Date End Date Taking? Authorizing Provider  ferrous sulfate 325 (65 FE) MG tablet Take 1 tablet (325 mg total) by mouth 3 (three) times daily with meals. 02/01/16  Yes Tresa Garter, MD  hydrochlorothiazide (HYDRODIURIL) 25 MG tablet Take 1 tablet (25 mg total) by mouth daily. 03/07/18 04/06/18  Barry Dienes, NP  megestrol (MEGACE) 40 MG tablet Take 1 tablet (40 mg total) by mouth 2 (two) times daily. 08/11/15   Constant, Peggy, MD  metoprolol tartrate (LOPRESSOR) 50 MG tablet Take 1 tablet (50 mg total) by mouth 2 (two) times daily. 03/07/18 04/06/18  Barry Dienes, NP    Family History Family History  Problem  Relation Age of Onset  . Diabetes Mother   . Hypertension Father     Social History Social History   Tobacco Use  . Smoking status: Never Smoker  . Smokeless tobacco: Never Used  Substance Use Topics  . Alcohol use: No  . Drug use: No     Allergies   Patient has no known allergies.   Review of Systems Review of Systems  Constitutional:       See HPI     Physical Exam Triage Vital Signs ED Triage Vitals  Enc Vitals Group     BP 03/07/18 1352 (!) 180/89     Pulse Rate 03/07/18 1352 80     Resp 03/07/18 1352 16     Temp 03/07/18 1352 97.9 F (36.6 C)     Temp Source 03/07/18 1352 Oral     SpO2 03/07/18 1352 99 %     Weight --      Height --      Head Circumference --      Peak Flow --      Pain Score 03/07/18 1353 4  Pain Loc --      Pain Edu? --      Excl. in [place]? --    No data found.  Updated Vital Signs BP (!) 180/89   Pulse 80   Temp 97.9 F (36.6 C) (Oral)   Resp 16   LMP 02/11/2018 (Approximate)   SpO2 99%   Physical Exam  Constitutional: She is oriented to person, place, and time. She appears well-developed and well-nourished.  HENT:  Head: Normocephalic and atraumatic.  Right Ear: External ear normal.  Left Ear: External ear normal.  Nose: Nose normal.  Mouth/Throat: Oropharynx is clear and moist. No oropharyngeal exudate.  Right ear canal and TM unremarkable.  Left ear: large amt of moist cerumen present initially, was able to see the TM after clinical staff irrigated the ear, TM was erythematous without perforation.   Cardiovascular: Normal rate, regular rhythm and normal heart sounds.  Pulmonary/Chest: Effort normal and breath sounds normal. She has no wheezes.  Neurological: She is alert and oriented to person, place, and time.  Nursing note and vitals reviewed.   UC Treatments / Results  Labs (all labs ordered are listed, but only abnormal results are displayed) Labs Reviewed - No data to display  EKG None  Radiology No  results found.  Procedures Procedures (including critical care time)  Medications Ordered in UC Medications - No data to display  Initial Impression / Assessment and Plan / UC Course  I have reviewed the triage vital signs and the nursing notes.  Pertinent labs & imaging results that were available during my care of the patient were reviewed by me and considered in my medical decision making (see chart for details).  Final Clinical Impressions(s) / UC Diagnoses   Final diagnoses:  Other non-recurrent acute nonsuppurative otitis media of left ear  Benign hypertension   Clinical presentation concerning for Otitis media. Starting patient on Cefdinir BID x 7 days.  Metoprolol and HCTZ refilled, f/u with PCP in 1-2 weeks.  Reviewed directions for usage and side effects. Patient states understanding and will call with questions or problems.   Discharge Instructions   None    ED Prescriptions    Medication Sig Dispense Auth. Provider   metoprolol tartrate (LOPRESSOR) 50 MG tablet Take 1 tablet (50 mg total) by mouth 2 (two) times daily. 60 tablet Barry Dienes, NP   hydrochlorothiazide (HYDRODIURIL) 25 MG tablet Take 1 tablet (25 mg total) by mouth daily. 30 tablet Barry Dienes, NP     Controlled Substance Prescriptions [place] Controlled Substance Registry consulted? Not Applicable   Barry Dienes, NP 03/07/18 1454

## 2018-03-07 NOTE — ED Triage Notes (Signed)
C/O left earache since last night.

## 2018-03-11 ENCOUNTER — Encounter: Payer: Self-pay | Admitting: Nurse Practitioner

## 2018-03-11 ENCOUNTER — Ambulatory Visit: Payer: Self-pay | Attending: Nurse Practitioner | Admitting: Nurse Practitioner

## 2018-03-11 VITALS — BP 159/100 | HR 96 | Temp 98.8°F | Ht 63.0 in | Wt 195.4 lb

## 2018-03-11 DIAGNOSIS — I10 Essential (primary) hypertension: Secondary | ICD-10-CM | POA: Insufficient documentation

## 2018-03-11 DIAGNOSIS — Z79899 Other long term (current) drug therapy: Secondary | ICD-10-CM | POA: Insufficient documentation

## 2018-03-11 DIAGNOSIS — Z8249 Family history of ischemic heart disease and other diseases of the circulatory system: Secondary | ICD-10-CM | POA: Insufficient documentation

## 2018-03-11 DIAGNOSIS — Z7689 Persons encountering health services in other specified circumstances: Secondary | ICD-10-CM | POA: Insufficient documentation

## 2018-03-11 DIAGNOSIS — H65193 Other acute nonsuppurative otitis media, bilateral: Secondary | ICD-10-CM | POA: Insufficient documentation

## 2018-03-11 MED ORDER — AMLODIPINE BESYLATE 5 MG PO TABS: 5.0000 mg | ORAL_TABLET | Freq: Every day | ORAL | 1 refills | Status: DC

## 2018-03-11 MED ORDER — OFLOXACIN 0.3 % OT SOLN: 5.0000 [drp] | Freq: Two times a day (BID) | OTIC | 0 refills | Status: AC

## 2018-03-11 MED ORDER — CHLORTHALIDONE 25 MG PO TABS: 25.0000 mg | ORAL_TABLET | Freq: Every day | ORAL | 1 refills | Status: DC

## 2018-03-11 MED FILL — OFLOXACIN 0.3% EAR DROPS: 0.3 | 14 days supply | Qty: 10 | Fill #0

## 2018-03-11 MED FILL — CHLORTHALIDONE 25 MG TAB: 25 | 30 days supply | Qty: 30 | Fill #0

## 2018-03-11 MED FILL — AMLODIPINE BESYLATE 5 MG TA: 5 | 30 days supply | Qty: 30 | Fill #0

## 2018-03-11 NOTE — Progress Notes (Signed)
Assessment & Plan:  Abigail Gonzalez was seen today for establish care.  Diagnoses and all orders for this visit:  Other acute nonsuppurative otitis media of both ears, recurrence not specified -     ofloxacin (FLOXIN) 0.3 % OTIC solution; Place 5 drops into both ears 2 (two) times daily for 14 days.  Essential hypertension -     chlorthalidone (HYGROTON) 25 MG tablet; Take 1 tablet (25 mg total) by mouth daily. -     amLODipine (NORVASC) 5 MG tablet; Take 1 tablet (5 mg total) by mouth daily. Continue all antihypertensives as prescribed.  Remember to bring in your blood pressure log with you for your follow up appointment.  DASH/Mediterranean Diets are healthier choices for HTN.    Patient has been counseled on age-appropriate routine health concerns for screening and prevention. These are reviewed and up-to-date. Referrals have been placed accordingly. Immunizations are up-to-date or declined.    Subjective:   Chief Complaint  Patient presents with  . Establish Care    Pt. is here for pain on both of her eyes and her throat hurts. Pt. stated she can't hear well.    HPI Abigail Gonzalez 52 y.o. female presents to office today to establish care.    Otitis Media She was the urgent care center on 05/19/2018 with complaints of left otalgia.  Initially the left ear required irrigation due to cerumen impaction and upon impaction noted for erythema without perforation.  She was treated for acute otitis media with cefdinir twice daily for 7 days.  Today she endorses bilateral ear pain with decreased hearing in both years.  This is day 4 of 7 taking cefdinir.   Essential Hypertension Chronic.    Blood pressure was noted to be elevated during her ED visit on 03/07/2018 and she was prescribed hydrochlorothiazide 25 mg and metoprolol 25 mg twice daily which today she tells me she did not pick up from the pharmacy. When I  questioned why she has not been taking her blood pressure medications she  states "no reason". Will start her on chlorthalidone 25mg  today as well as norvasc 5mg . Denies chest pain, shortness of breath, palpitations, lightheadedness, dizziness, headaches or BLE edema.  BP Readings from Last 3 Encounters:  03/11/18 (!) 159/100  03/07/18 (!) 180/89  02/01/16 (!) 142/84    Review of Systems  Constitutional: Negative for fever, malaise/fatigue and weight loss.  HENT: Positive for ear pain, hearing loss, sore throat and tinnitus. Negative for nosebleeds.   Eyes: Negative.  Negative for blurred vision, double vision and photophobia.  Respiratory: Negative.  Negative for cough and shortness of breath.   Cardiovascular: Negative.  Negative for chest pain, palpitations and leg swelling.  Gastrointestinal: Negative.  Negative for heartburn, nausea and vomiting.  Musculoskeletal: Negative.  Negative for myalgias.  Neurological: Negative.  Negative for dizziness, focal weakness, seizures and headaches.  Psychiatric/Behavioral: Negative.  Negative for suicidal ideas.    Past Medical History:  Diagnosis Date  . Hypertension     Past Surgical History:  Procedure Laterality Date  . NO PAST SURGERIES      Family History  Problem Relation Age of Onset  . Diabetes Mother   . Hypertension Father     Social History Reviewed with no changes to be made today.   Outpatient Medications Prior to Visit  Medication Sig Dispense Refill  . acetaminophen (TYLENOL) 500 MG tablet Take 500 mg by mouth every 6 (six) hours as needed.    . cefdinir (  OMNICEF) 300 MG capsule Take 1 capsule (300 mg total) by mouth 2 (two) times daily for 7 days. 14 capsule 0  . ferrous sulfate 325 (65 FE) MG tablet Take 1 tablet (325 mg total) by mouth 3 (three) times daily with meals. (Patient not taking: Reported on 03/11/2018) 270 tablet 3  . hydrochlorothiazide (HYDRODIURIL) 25 MG tablet Take 1 tablet (25 mg total) by mouth daily. (Patient not taking: Reported on 03/11/2018) 30 tablet 0  . megestrol  (MEGACE) 40 MG tablet Take 1 tablet (40 mg total) by mouth 2 (two) times daily. (Patient not taking: Reported on 03/11/2018) 60 tablet 3  . metoprolol tartrate (LOPRESSOR) 50 MG tablet Take 1 tablet (50 mg total) by mouth 2 (two) times daily. (Patient not taking: Reported on 03/11/2018) 60 tablet 0   No facility-administered medications prior to visit.     No Known Allergies     Objective:    BP (!) 159/100 (BP Location: Left Arm, Patient Position: Sitting, Cuff Size: Normal)   Pulse 96   Temp 98.8 F (37.1 C) (Oral)   Ht 5\' 3"  (1.6 m)   Wt 195 lb 6.4 oz (88.6 kg)   LMP 02/11/2018 (Approximate)   SpO2 100%   BMI 34.61 kg/m  Wt Readings from Last 3 Encounters:  03/11/18 195 lb 6.4 oz (88.6 kg)  02/01/16 194 lb 12.8 oz (88.4 kg)  08/11/15 180 lb 4.8 oz (81.8 kg)    Physical Exam  Constitutional: She is oriented to person, place, and time. She appears well-developed and well-nourished. She is cooperative.  HENT:  Head: Normocephalic and atraumatic.  Right Ear: Tympanic membrane is erythematous. A middle ear effusion is present. Decreased hearing is noted.  Left Ear: Tympanic membrane is erythematous. A middle ear effusion is present. Decreased hearing is noted.  Eyes: EOM are normal.  Neck: Normal range of motion.  Cardiovascular: Normal rate, regular rhythm and normal heart sounds. Exam reveals no gallop and no friction rub.  No murmur heard. Pulmonary/Chest: Effort normal and breath sounds normal. No tachypnea. No respiratory distress. She has no decreased breath sounds. She has no wheezes. She has no rhonchi. She has no rales. She exhibits no tenderness.  Abdominal: Soft. Bowel sounds are normal.  Musculoskeletal: Normal range of motion. She exhibits no edema.  Neurological: She is alert and oriented to person, place, and time. Coordination normal.  Skin: Skin is warm and dry.  Psychiatric: She has a normal mood and affect. Her behavior is normal. Judgment and thought content  normal.  Nursing note and vitals reviewed.      Patient has been counseled extensively about nutrition and exercise as well as the importance of adherence with medications and regular follow-up. The patient was given clear instructions to go to ER or return to medical center if symptoms don't improve, worsen or new problems develop. The patient verbalized understanding.   Follow-up: Return in about 2 weeks (around 03/25/2018) for BP recheck/OTITIS MEDIA.   Gildardo Pounds, FNP-BC [facility] and [facility], [place]   03/11/2018, 4:44 PM

## 2018-03-16 ENCOUNTER — Ambulatory Visit: Payer: Self-pay | Attending: Nurse Practitioner

## 2018-04-03 ENCOUNTER — Encounter: Payer: Self-pay | Admitting: Nurse Practitioner

## 2018-04-03 ENCOUNTER — Ambulatory Visit: Payer: Self-pay | Attending: Nurse Practitioner | Admitting: Nurse Practitioner

## 2018-04-03 VITALS — BP 143/87 | HR 111 | Temp 98.4°F | Ht 63.0 in | Wt 193.0 lb

## 2018-04-03 DIAGNOSIS — I10 Essential (primary) hypertension: Secondary | ICD-10-CM | POA: Insufficient documentation

## 2018-04-03 DIAGNOSIS — R000 Tachycardia, unspecified: Secondary | ICD-10-CM | POA: Insufficient documentation

## 2018-04-03 DIAGNOSIS — Z833 Family history of diabetes mellitus: Secondary | ICD-10-CM | POA: Insufficient documentation

## 2018-04-03 DIAGNOSIS — H6693 Otitis media, unspecified, bilateral: Secondary | ICD-10-CM | POA: Insufficient documentation

## 2018-04-03 DIAGNOSIS — Z79899 Other long term (current) drug therapy: Secondary | ICD-10-CM | POA: Insufficient documentation

## 2018-04-03 DIAGNOSIS — Z8249 Family history of ischemic heart disease and other diseases of the circulatory system: Secondary | ICD-10-CM | POA: Insufficient documentation

## 2018-04-03 MED ORDER — CHLORTHALIDONE 25 MG PO TABS: 25.0000 mg | ORAL_TABLET | Freq: Every day | ORAL | 1 refills | Status: DC

## 2018-04-03 MED ORDER — AMLODIPINE BESYLATE 5 MG PO TABS: 5.0000 mg | ORAL_TABLET | Freq: Every day | ORAL | 1 refills | Status: DC

## 2018-04-03 NOTE — Progress Notes (Signed)
Assessment & Plan:  Abigail Gonzalez was seen today for follow-up.  Diagnoses and all orders for this visit:  Essential hypertension -     amLODipine (NORVASC) 5 MG tablet; Take 1 tablet (5 mg total) by mouth daily. -     chlorthalidone (HYGROTON) 25 MG tablet; Take 1 tablet (25 mg total) by mouth daily. -     CBC -     CMP14+EGFR -     Lipid panel  Tachycardia -     TSH    Patient has been counseled on age-appropriate routine health concerns for screening and prevention. These are reviewed and up-to-date. Referrals have been placed accordingly. Immunizations are up-to-date or declined.    Subjective:   Chief Complaint  Patient presents with  . Follow-up    Pt. is here for a follow-up for blood pressure check. P.t stated she can hear much better now. Pt. would like to get her iron check.    HPI Abigail Gonzalez 52 y.o. female presents to office today for follow up to HTN and bilateral otitis media. Symptoms of otitis media have resolved.   Essential Hypertension Blood pressure is slightly elevated today. She endorses medication compliance taking amlodipine 22m and chlorthalidone 290mnightly. She states she only slept for about 4 hours total within the past 24 hours and feels she is coming down with a cold. Denies chest pain, shortness of breath, palpitations, lightheadedness, dizziness, headaches or BLE edema. She is tachycardic today. Could be viral related vs thyroid. She does have a history of slightly elevated thyroid in the past with no repeat blood work.  BP Readings from Last 3 Encounters:  04/03/18 (!) 143/87  03/11/18 (!) 159/100  03/07/18 (!) 180/89    Review of Systems  Constitutional: Negative for fever, malaise/fatigue and weight loss.  HENT: Positive for congestion. Negative for nosebleeds.        Rhinorrhea  Eyes: Negative.  Negative for blurred vision, double vision and photophobia.  Respiratory: Positive for cough. Negative for shortness of breath.     Cardiovascular: Negative.  Negative for chest pain, palpitations and leg swelling.  Gastrointestinal: Negative.  Negative for heartburn, nausea and vomiting.  Musculoskeletal: Negative.  Negative for myalgias.  Neurological: Negative.  Negative for dizziness, focal weakness, seizures and headaches.  Psychiatric/Behavioral: Negative.  Negative for suicidal ideas.    Past Medical History:  Diagnosis Date  . Hypertension     Past Surgical History:  Procedure Laterality Date  . NO PAST SURGERIES      Family History  Problem Relation Age of Onset  . Diabetes Mother   . Hypertension Father     Social History Reviewed with no changes to be made today.   Outpatient Medications Prior to Visit  Medication Sig Dispense Refill  . acetaminophen (TYLENOL) 500 MG tablet Take 500 mg by mouth every 6 (six) hours as needed.    . Marland KitchenmLODipine (NORVASC) 5 MG tablet Take 1 tablet (5 mg total) by mouth daily. 30 tablet 1  . chlorthalidone (HYGROTON) 25 MG tablet Take 1 tablet (25 mg total) by mouth daily. 30 tablet 1   No facility-administered medications prior to visit.     No Known Allergies     Objective:    BP (!) 143/87 (BP Location: Left Arm, Patient Position: Sitting, Cuff Size: Normal)   Pulse (!) 111   Temp 98.4 F (36.9 C) (Oral)   Ht _0  (1.6 m)   Wt 193 lb (87.5 kg)   SpO2  100%   BMI 34.19 kg/m  Wt Readings from Last 3 Encounters:  04/03/18 193 lb (87.5 kg)  03/11/18 195 lb 6.4 oz (88.6 kg)  02/01/16 194 lb 12.8 oz (88.4 kg)    Physical Exam  Constitutional: She is oriented to person, place, and time. She appears well-developed and well-nourished. She is cooperative.  HENT:  Head: Normocephalic and atraumatic.  Right Ear: Hearing, tympanic membrane, external ear and ear canal normal.  Left Ear: Hearing, tympanic membrane, external ear and ear canal normal.  Nose: Mucosal edema and rhinorrhea present.  Mouth/Throat: Uvula is midline, oropharynx is clear and moist  and mucous membranes are normal.  Eyes: EOM are normal.  Neck: Normal range of motion.  Cardiovascular: Normal rate, regular rhythm, normal heart sounds and intact distal pulses. Exam reveals no gallop and no friction rub.  No murmur heard. Pulmonary/Chest: Effort normal and breath sounds normal. No tachypnea. No respiratory distress. She has no decreased breath sounds. She has no wheezes. She has no rhonchi. She has no rales. She exhibits no tenderness.  Abdominal: Soft. Bowel sounds are normal.  Musculoskeletal: Normal range of motion. She exhibits no edema.  Neurological: She is alert and oriented to person, place, and time. Coordination normal.  Skin: Skin is warm and dry.  Psychiatric: She has a normal mood and affect. Her behavior is normal. Judgment and thought content normal.  Nursing note and vitals reviewed.        Patient has been counseled extensively about nutrition and exercise as well as the importance of adherence with medications and regular follow-up. The patient was given clear instructions to go to ER or return to medical center if symptoms don't improve, worsen or new problems develop. The patient verbalized understanding.   Follow-up: Return in about 3 weeks (around 04/24/2018) for PAP SMEAR and BP recheck.   Gildardo Pounds, FNP-BC [facility] and [place], [place]   04/03/2018, 4:12 PM

## 2018-04-03 NOTE — Patient Instructions (Signed)
Sinus Tachycardia Sinus tachycardia is a kind of fast heartbeat. In sinus tachycardia, the heart beats more than 100 times a minute. Sinus tachycardia starts in a part of the heart called the sinus node. Sinus tachycardia may be harmless, or it may be a sign of a serious condition. What are the causes? This condition may be caused by:  Exercise or exertion.  A fever.  Pain.  Loss of body fluids (dehydration).  Severe bleeding (hemorrhage).  Anxiety and stress.  Certain substances, including: ? Alcohol. ? Caffeine. ? Tobacco and nicotine products. ? Diet pills. ? Illegal drugs.  Medical conditions including: ? Heart disease. ? An infection. ? An overactive thyroid (hyperthyroidism). ? A lack of red blood cells (anemia).  What are the signs or symptoms? Symptoms of this condition include:  A feeling that the heart is beating quickly (palpitations).  Suddenly noticing your heartbeat (cardiac awareness).  Dizziness.  Tiredness (fatigue).  Shortness of breath.  Chest pain.  Nausea.  Fainting.  How is this diagnosed? This condition is diagnosed with:  A physical exam.  Other tests, such as: ? Blood tests. ? An electrocardiogram (ECG). This test measures the electrical activity of the heart. ? Holter monitoring. For this test, you wear a device that records your heartbeat for one or more days.  You may be referred to a heart specialist (cardiologist). How is this treated? Treatment for this condition depends on the cause or underlying condition. Treatment may involve:  Treating the underlying condition.  Taking new medicines or changing your current medicines as told by your health care provider.  Making changes to your diet or lifestyle.  Practicing relaxation methods.  Follow these instructions at home: Lifestyle  Do not use any products that contain nicotine or tobacco, such as cigarettes and e-cigarettes. If you need help quitting, ask your  health care provider.  Learn relaxation methods, like deep breathing, to help you when you get stressed or anxious.  Do not use illegal drugs, such as cocaine.  Do not abuse alcohol. Limit alcohol intake to no more than 1 drink a day for non-pregnant women and 2 drinks a day for men. One drink equals 12 oz of beer, 5 oz of wine, or 1 oz of hard liquor.  Find time to rest and relax often. This reduces stress.  Avoid: ? Caffeine. ? Stimulants such as over-the-counter diet pills or pills that help you to stay awake. ? Situations that cause anxiety or stress. General instructions  Drink enough fluids to keep your urine clear or pale yellow.  Take over-the-counter and prescription medicines only as told by your health care provider.  Keep all follow-up visits as told by your health care provider. This is important. Contact a health care provider if:  You have a fever.  You have vomiting or diarrhea that keeps happening (is persistent). Get help right away if:  You have pain in your chest, upper arms, jaw, or neck.  You become weak or dizzy.  You feel faint.  You have palpitations that do not go away. This information is not intended to replace advice given to you by your health care provider. Make sure you discuss any questions you have with your health care provider. Document Released: 10/24/2004 Document Revised: 04/13/2016 Document Reviewed: 03/31/2015 Elsevier Interactive Patient Education  2018 [employer].  Anemia Anemia is a condition in which you do not have enough red blood cells or hemoglobin. Hemoglobin is a substance in red blood cells that carries oxygen.  When you do not have enough red blood cells or hemoglobin (are anemic), your body cannot get enough oxygen and your organs may not work properly. As a result, you may feel very tired or have other problems. What are the causes? Common causes of anemia include:  Excessive bleeding. Anemia can be caused by  excessive bleeding inside or outside the body, including bleeding from the intestine or from periods in women.  Poor nutrition.  Long-lasting (chronic) kidney, thyroid, and liver disease.  Bone marrow disorders.  Cancer and treatments for cancer.  HIV (human immunodeficiency virus) and AIDS (acquired immunodeficiency syndrome).  Treatments for HIV and AIDS.  Spleen problems.  Blood disorders.  Infections, medicines, and autoimmune disorders that destroy red blood cells.  What are the signs or symptoms? Symptoms of this condition include:  Minor weakness.  Dizziness.  Headache.  Feeling heartbeats that are irregular or faster than normal (palpitations).  Shortness of breath, especially with exercise.  Paleness.  Cold sensitivity.  Indigestion.  Nausea.  Difficulty sleeping.  Difficulty concentrating.  Symptoms may occur suddenly or develop slowly. If your anemia is mild, you may not have symptoms. How is this diagnosed? This condition is diagnosed based on:  Blood tests.  Your medical history.  A physical exam.  Bone marrow biopsy.  Your health care provider may also check your stool (feces) for blood and may do additional testing to look for the cause of your bleeding. You may also have other tests, including:  Imaging tests, such as a CT scan or MRI.  Endoscopy.  Colonoscopy.  How is this treated? Treatment for this condition depends on the cause. If you continue to lose a lot of blood, you may need to be treated at a hospital. Treatment may include:  Taking supplements of iron, vitamin T62, or folic acid.  Taking a hormone medicine (erythropoietin) that can help to stimulate red blood cell growth.  Having a blood transfusion. This may be needed if you lose a lot of blood.  Making changes to your diet.  Having surgery to remove your spleen.  Follow these instructions at home:  Take over-the-counter and prescription medicines only as  told by your health care provider.  Take supplements only as told by your health care provider.  Follow any diet instructions that you were given.  Keep all follow-up visits as told by your health care provider. This is important. Contact a health care provider if:  You develop new bleeding anywhere in the body. Get help right away if:  You are very weak.  You are short of breath.  You have pain in your abdomen or chest.  You are dizzy or feel faint.  You have trouble concentrating.  You have bloody or black, tarry stools.  You vomit repeatedly or you vomit up blood. Summary  Anemia is a condition in which you do not have enough red blood cells or enough of a substance in your red blood cells that carries oxygen (hemoglobin).  Symptoms may occur suddenly or develop slowly.  If your anemia is mild, you may not have symptoms.  This condition is diagnosed with blood tests as well as a medical history and physical exam. Other tests may be needed.  Treatment for this condition depends on the cause of the anemia. This information is not intended to replace advice given to you by your health care provider. Make sure you discuss any questions you have with your health care provider. Document Released: 10/24/2004 Document Revised: 10/18/2016  Document Reviewed: 10/18/2016 Elsevier Interactive Patient Education  [employer].

## 2018-04-04 LAB — CMP14+EGFR
ALT: 13 IU/L (ref 0–32)
AST: 13 IU/L (ref 0–40)
Albumin/Globulin Ratio: 1.2 (ref 1.2–2.2)
Albumin: 4 g/dL (ref 3.5–5.5)
Alkaline Phosphatase: 81 IU/L (ref 39–117)
BILIRUBIN TOTAL: 0.3 mg/dL (ref 0.0–1.2)
BUN / CREAT RATIO: 18 (ref 9–23)
BUN: 11 mg/dL (ref 6–24)
CHLORIDE: 99 mmol/L (ref 96–106)
CO2: 25 mmol/L (ref 20–29)
Calcium: 9.3 mg/dL (ref 8.7–10.2)
Creatinine, Ser: 0.61 mg/dL (ref 0.57–1.00)
GFR calc non Af Amer: 105 mL/min/{1.73_m2} (ref >59)
GFR, EST AFRICAN AMERICAN: 121 mL/min/{1.73_m2} (ref >59)
Globulin, Total: 3.3 g/dL (ref 1.5–4.5)
Glucose: 161 mg/dL — ABNORMAL HIGH (ref 65–99)
Potassium: 3.1 mmol/L — ABNORMAL LOW (ref 3.5–5.2)
Sodium: 140 mmol/L (ref 134–144)
TOTAL PROTEIN: 7.3 g/dL (ref 6.0–8.5)

## 2018-04-04 LAB — CBC
HEMOGLOBIN: 5.9 g/dL — AB (ref 11.1–15.9)
Hematocrit: 22.7 % — ABNORMAL LOW (ref 34.0–46.6)
MCH: 16.9 pg — AB (ref 26.6–33.0)
MCHC: 26 g/dL — AB (ref 31.5–35.7)
MCV: 65 fL — ABNORMAL LOW (ref 79–97)
PLATELETS: 481 10*3/uL — AB (ref 150–450)
RBC: 3.49 x10E6/uL — AB (ref 3.77–5.28)
RDW: 19.1 % — ABNORMAL HIGH (ref 12.3–15.4)
WBC: 8.6 10*3/uL (ref 3.4–10.8)

## 2018-04-04 LAB — LIPID PANEL
Chol/HDL Ratio: 2.6 ratio (ref 0.0–4.4)
Cholesterol, Total: 150 mg/dL (ref 100–199)
HDL: 58 mg/dL (ref >39)
LDL CALC: 75 mg/dL (ref 0–99)
Triglycerides: 85 mg/dL (ref 0–149)
VLDL CHOLESTEROL CAL: 17 mg/dL (ref 5–40)

## 2018-04-04 LAB — TSH: TSH: 5.16 u[IU]/mL — AB (ref 0.450–4.500)

## 2018-04-06 ENCOUNTER — Other Ambulatory Visit: Payer: Self-pay

## 2018-04-06 ENCOUNTER — Other Ambulatory Visit: Payer: Self-pay | Admitting: Nurse Practitioner

## 2018-04-06 ENCOUNTER — Observation Stay (HOSPITAL_COMMUNITY)
Admission: EM | Admit: 2018-04-06 | Discharge: 2018-04-07 | Disposition: A | Discharge status: Home / Self Care | Payer: Medicaid Other | Attending: Family Medicine | Admitting: Family Medicine

## 2018-04-06 ENCOUNTER — Telehealth: Payer: Self-pay

## 2018-04-06 ENCOUNTER — Encounter (HOSPITAL_COMMUNITY): Payer: Self-pay

## 2018-04-06 DIAGNOSIS — R000 Tachycardia, unspecified: Secondary | ICD-10-CM | POA: Diagnosis not present

## 2018-04-06 DIAGNOSIS — D500 Iron deficiency anemia secondary to blood loss (chronic): Principal | ICD-10-CM | POA: Insufficient documentation

## 2018-04-06 DIAGNOSIS — I10 Essential (primary) hypertension: Secondary | ICD-10-CM

## 2018-04-06 DIAGNOSIS — I493 Ventricular premature depolarization: Secondary | ICD-10-CM | POA: Insufficient documentation

## 2018-04-06 DIAGNOSIS — D649 Anemia, unspecified: Secondary | ICD-10-CM

## 2018-04-06 DIAGNOSIS — N920 Excessive and frequent menstruation with regular cycle: Secondary | ICD-10-CM | POA: Diagnosis not present

## 2018-04-06 DIAGNOSIS — E876 Hypokalemia: Secondary | ICD-10-CM

## 2018-04-06 DIAGNOSIS — Z79899 Other long term (current) drug therapy: Secondary | ICD-10-CM | POA: Insufficient documentation

## 2018-04-06 DIAGNOSIS — N950 Postmenopausal bleeding: Secondary | ICD-10-CM | POA: Diagnosis present

## 2018-04-06 LAB — CBC WITH DIFFERENTIAL/PLATELET
BASOS ABS: 0.1 10*3/uL (ref 0.0–0.1)
BASOS PCT: 1 %
EOS ABS: 0.1 10*3/uL (ref 0.0–0.7)
Eosinophils Relative: 1 %
HCT: 24.6 % — ABNORMAL LOW (ref 36.0–46.0)
HEMOGLOBIN: 6.3 g/dL — AB (ref 12.0–15.0)
LYMPHS PCT: 19 %
Lymphs Abs: 1.2 10*3/uL (ref 0.7–4.0)
MCH: 17.3 pg — ABNORMAL LOW (ref 26.0–34.0)
MCHC: 25.6 g/dL — AB (ref 30.0–36.0)
MCV: 67.6 fL — ABNORMAL LOW (ref 78.0–100.0)
MONO ABS: 0.6 10*3/uL (ref 0.1–1.0)
Monocytes Relative: 9 %
NEUTROS ABS: 4.3 10*3/uL (ref 1.7–7.7)
NEUTROS PCT: 70 %
Platelets: 363 10*3/uL (ref 150–400)
RBC: 3.64 MIL/uL — ABNORMAL LOW (ref 3.87–5.11)
RDW: 20.6 % — AB (ref 11.5–15.5)
WBC: 6.3 10*3/uL (ref 4.0–10.5)

## 2018-04-06 LAB — COMPREHENSIVE METABOLIC PANEL
ALK PHOS: 77 U/L (ref 38–126)
ALT: 15 U/L (ref 0–44)
ANION GAP: 16 — AB (ref 5–15)
AST: 22 U/L (ref 15–41)
Albumin: 3.4 g/dL — ABNORMAL LOW (ref 3.5–5.0)
BILIRUBIN TOTAL: 0.6 mg/dL (ref 0.3–1.2)
BUN: 10 mg/dL (ref 6–20)
CALCIUM: 9 mg/dL (ref 8.9–10.3)
CO2: 22 mmol/L (ref 22–32)
CREATININE: 0.7 mg/dL (ref 0.44–1.00)
Chloride: 100 mmol/L (ref 98–111)
GFR calc non Af Amer: >60 mL/min (ref >60)
Glucose, Bld: 126 mg/dL — ABNORMAL HIGH (ref 70–99)
Potassium: 2.5 mmol/L — CL (ref 3.5–5.1)
SODIUM: 138 mmol/L (ref 135–145)
TOTAL PROTEIN: 7.6 g/dL (ref 6.5–8.1)

## 2018-04-06 LAB — I-STAT BETA HCG BLOOD, ED (MC, WL, AP ONLY)

## 2018-04-06 LAB — RETICULOCYTES
RBC.: 3.49 MIL/uL — ABNORMAL LOW (ref 3.87–5.11)
RETIC COUNT ABSOLUTE: 97.7 10*3/uL (ref 19.0–186.0)
Retic Ct Pct: 2.8 % (ref 0.4–3.1)

## 2018-04-06 LAB — IRON AND TIBC
Iron: 13 ug/dL — ABNORMAL LOW (ref 28–170)
Saturation Ratios: 3 % — ABNORMAL LOW (ref 10.4–31.8)
TIBC: 410 ug/dL (ref 250–450)
UIBC: 397 ug/dL

## 2018-04-06 LAB — ABO/RH: ABO/RH(D): A POS

## 2018-04-06 LAB — PROTIME-INR
INR: 1.06
PROTHROMBIN TIME: 13.7 s (ref 11.4–15.2)

## 2018-04-06 LAB — SAVE SMEAR

## 2018-04-06 LAB — FOLATE: Folate: 11.7 ng/mL (ref >5.9)

## 2018-04-06 LAB — VITAMIN B12: Vitamin B-12: 318 pg/mL (ref 180–914)

## 2018-04-06 LAB — FERRITIN: FERRITIN: 5 ng/mL — AB (ref 11–307)

## 2018-04-06 LAB — PREPARE RBC (CROSSMATCH)

## 2018-04-06 MED ORDER — FERROUS SULFATE 325 (65 FE) MG PO TBEC: 325.0000 mg | DELAYED_RELEASE_TABLET | Freq: Three times a day (TID) | ORAL | 3 refills | Status: DC

## 2018-04-06 MED ORDER — ONDANSETRON HCL 4 MG PO TABS: 4.0000 mg | ORAL_TABLET | Freq: Four times a day (QID) | ORAL | Status: DC | PRN

## 2018-04-06 MED ORDER — CHLORTHALIDONE 25 MG PO TABS
25.0000 mg | ORAL_TABLET | Freq: Every day | ORAL | Status: DC
  Administered 2018-04-07: 25 mg via ORAL
  Filled 2018-04-06: qty 1

## 2018-04-06 MED ORDER — ACETAMINOPHEN 500 MG PO TABS: 500.0000 mg | ORAL_TABLET | Freq: Four times a day (QID) | ORAL | Status: DC | PRN

## 2018-04-06 MED ORDER — POTASSIUM CHLORIDE CRYS ER 20 MEQ PO TBCR
40.0000 meq | EXTENDED_RELEASE_TABLET | Freq: Once | ORAL | Status: AC
  Administered 2018-04-06: 40 meq via ORAL
  Filled 2018-04-06: qty 2

## 2018-04-06 MED ORDER — AMLODIPINE BESYLATE 5 MG PO TABS
5.0000 mg | ORAL_TABLET | Freq: Every day | ORAL | Status: DC
  Administered 2018-04-07: 5 mg via ORAL
  Filled 2018-04-06: qty 1

## 2018-04-06 MED ORDER — POTASSIUM CHLORIDE 10 MEQ/100ML IV SOLN
10.0000 meq | INTRAVENOUS | Status: AC
Start: 2018-04-06 — End: 2018-04-07
  Administered 2018-04-06 (×2): 10 meq via INTRAVENOUS
  Filled 2018-04-06 (×4): qty 100

## 2018-04-06 MED ORDER — ONDANSETRON HCL 4 MG/2ML IJ SOLN: 4.0000 mg | Freq: Four times a day (QID) | INTRAMUSCULAR | Status: DC | PRN

## 2018-04-06 MED ORDER — SODIUM CHLORIDE 0.9 % IV SOLN
10.0000 mL/h | Freq: Once | INTRAVENOUS | Status: AC
  Administered 2018-04-06: 10 mL/h via INTRAVENOUS

## 2018-04-06 MED ORDER — ACETAMINOPHEN 325 MG PO TABS: 650.0000 mg | ORAL_TABLET | Freq: Four times a day (QID) | ORAL | Status: DC | PRN

## 2018-04-06 MED ORDER — ACETAMINOPHEN 650 MG RE SUPP: 650.0000 mg | Freq: Four times a day (QID) | RECTAL | Status: DC | PRN

## 2018-04-06 MED ORDER — FERROUS SULFATE 325 (65 FE) MG PO TABS
325.0000 mg | ORAL_TABLET | Freq: Three times a day (TID) | ORAL | Status: DC
  Administered 2018-04-07 (×2): 325 mg via ORAL
  Filled 2018-04-06 (×2): qty 1

## 2018-04-06 MED FILL — FERROUS SULFATE 325 MG TAB: 325 (65 FE) | 30 days supply | Qty: 90 | Fill #0

## 2018-04-06 NOTE — Telephone Encounter (Signed)
-----   Message from Gildardo Pounds, NP sent at 04/06/2018 10:14 AM EDT ----- Please call patient and let her know her hgb is critically low. She needs to go to the ED for further treatment and evaluation. Thank you

## 2018-04-06 NOTE — Telephone Encounter (Signed)
CMA spoke to patient to inform her lab results and to go to Emergency Room to get treated.  Pt. Understood.

## 2018-04-06 NOTE — H&P (Signed)
History and Physical    Abigail Gonzalez VZC:[id number] DOB: May 15, 1966 DOA: 04/06/2018  PCP: Gildardo Pounds, NP  Patient coming from: Home  I have personally briefly reviewed patient's old medical records in [place]  Chief Complaint: Anemia  HPI: Abigail Gonzalez is a 52 y.o. female with medical history significant of HTN, iron deficiency anemia due to menorrhagia.  Patient is not currently taking chronic iron, though she reports ongoing heavy menstural cycles (currently mid cycle).  Patient presents to the ED with c/o generalized fatigue, abnormal lab values at PCP (specifically HGB 5.9 a few days ago).  No CP, no SOB.  No melena, no hematochezia.   ED Course: HGB 6.3.  2u PRBC transfusion ordered.   Review of Systems: As per HPI otherwise 10 point review of systems negative.   Past Medical History:  Diagnosis Date  . Hypertension     Past Surgical History:  Procedure Laterality Date  . NO PAST SURGERIES       reports that she has never smoked. She has never used smokeless tobacco. She reports that she does not drink alcohol or use drugs.  No Known Allergies  Family History  Problem Relation Age of Onset  . Diabetes Mother   . Hypertension Father      Prior to Admission medications   Medication Sig Start Date End Date Taking? Authorizing Provider  acetaminophen (TYLENOL) 500 MG tablet Take 500 mg by mouth every 6 (six) hours as needed for mild pain.    Yes [provider]  amLODipine (NORVASC) 5 MG tablet Take 1 tablet (5 mg total) by mouth daily. 04/03/18  Yes Gildardo Pounds, NP  chlorthalidone (HYGROTON) 25 MG tablet Take 1 tablet (25 mg total) by mouth daily. 04/03/18 07/02/18 Yes Gildardo Pounds, NP  ferrous sulfate 325 (65 FE) MG EC tablet Take 1 tablet (325 mg total) by mouth 3 (three) times daily with meals. Patient not taking: Reported on 04/06/2018 04/06/18   Gildardo Pounds, NP    Physical Exam: Vitals:   04/06/18 1721 04/06/18 1722  04/06/18 1954 04/06/18 2215  BP: (!) 150/87  122/69 129/75  Pulse: (!) 118  65 99  Resp: 16  18 (!) 21  Temp: 98.4 F (36.9 C)  98.2 F (36.8 C)   TempSrc: Oral  Oral   SpO2: 100%  100% 100%  Weight:  87.5 kg (193 lb)    Height:  5\' 3"  (1.6 m)      Constitutional: NAD, calm, comfortable Eyes: PERRL, lids and conjunctivae normal ENMT: Mucous membranes are moist. Posterior pharynx clear of any exudate or lesions.Normal dentition.  Neck: normal, supple, no masses, no thyromegaly Respiratory: clear to auscultation bilaterally, no wheezing, no crackles. Normal respiratory effort. No accessory muscle use.  Cardiovascular: Regular rate and rhythm, no murmurs / rubs / gallops. No extremity edema. 2+ pedal pulses. No carotid bruits.  Abdomen: no tenderness, no masses palpated. No hepatosplenomegaly. Bowel sounds positive.  Musculoskeletal: no clubbing / cyanosis. No joint deformity upper and lower extremities. Good ROM, no contractures. Normal muscle tone.  Skin: no rashes, lesions, ulcers. No induration Neurologic: CN 2-12 grossly intact. Sensation intact, DTR normal. Strength 5/5 in all 4.  Psychiatric: Normal judgment and insight. Alert and oriented x 3. Normal mood.    Labs on Admission: I have personally reviewed following labs and imaging studies  CBC: Recent Labs  Lab 04/03/18 1602 04/06/18 1724  WBC 8.6 6.3  NEUTROABS  --  4.3  HGB 5.9* 6.3*  HCT 22.7* 24.6*  MCV 65* 67.6*  PLT 481* 638   Basic Metabolic Panel: Recent Labs  Lab 04/03/18 1602 04/06/18 1724  NA 140 138  K 3.1* 2.5*  CL 99 100  CO2 25 22  GLUCOSE 161* 126*  BUN 11 10  CREATININE 0.61 0.70  CALCIUM 9.3 9.0   GFR: Estimated Creatinine Clearance: 87.2 mL/min (by C-G formula based on SCr of 0.7 mg/dL). Liver Function Tests: Recent Labs  Lab 04/03/18 1602 04/06/18 1724  AST 13 22  ALT 13 15  ALKPHOS 81 77  BILITOT 0.3 0.6  PROT 7.3 7.6  ALBUMIN 4.0 3.4*   No results for input(s): LIPASE,  AMYLASE in the last 168 hours. No results for input(s): AMMONIA in the last 168 hours. Coagulation Profile: Recent Labs  Lab 04/06/18 2122  INR 1.06   Cardiac Enzymes: No results for input(s): CKTOTAL, CKMB, CKMBINDEX, TROPONINI in the last 168 hours. BNP (last 3 results) No results for input(s): PROBNP in the last 8760 hours. HbA1C: No results for input(s): HGBA1C in the last 72 hours. CBG: No results for input(s): GLUCAP in the last 168 hours. Lipid Profile: No results for input(s): CHOL, HDL, LDLCALC, TRIG, CHOLHDL, LDLDIRECT in the last 72 hours. Thyroid Function Tests: No results for input(s): TSH, T4TOTAL, FREET4, T3FREE, THYROIDAB in the last 72 hours. Anemia Panel: Recent Labs    04/06/18 2122  VITAMINB12 318  FOLATE 11.7  FERRITIN 5*  TIBC 410  IRON 13*  RETICCTPCT 2.8   Urine analysis:    Component Value Date/Time   COLORURINE YELLOW 02/01/2016 1618   APPEARANCEUR CLEAR 02/01/2016 1618   LABSPEC 1.025 02/01/2016 1618   PHURINE 6.0 02/01/2016 1618   GLUCOSEU NEGATIVE 02/01/2016 1618   HGBUR 3+ (A) 02/01/2016 1618   BILIRUBINUR NEGATIVE 02/01/2016 1618   KETONESUR NEGATIVE 02/01/2016 1618   PROTEINUR NEGATIVE 02/01/2016 1618   UROBILINOGEN 0.2 06/22/2015 2029   NITRITE NEGATIVE 02/01/2016 1618   LEUKOCYTESUR NEGATIVE 02/01/2016 1618    Radiological Exams on Admission: No results found.  EKG: Independently reviewed.  Assessment/Plan Principal Problem:   Iron deficiency anemia due to chronic blood loss Active Problems:   Hypertension   Menorrhagia   Symptomatic anemia   Hypokalemia    1. Iron defficiency anemia due to chronic blood loss - 1. Transfuse 2U PRBC 2. Repeat CBC in AM 3. Resume Iron pills 2. Menorrhagia - 1. F/u with GYN needed (d/w patient) 3. HTN - 1. Continue home BP meds 4. Hypokalemia - 1. Replace K 2. Repeat BMP in AM  DVT prophylaxis: SCDs Code Status: Full Family Communication: Family at bedside Disposition Plan:  Home after admit Consults called: None Admission status: Place in [place], [place] [facility] Pager [phone] Only works nights!  If 7AM-7PM, please contact the primary day team physician taking care of patient  [email or web address] Password [facility]  04/06/2018, 10:36 PM

## 2018-04-06 NOTE — ED Provider Notes (Signed)
Emergency Department Provider Note   I have reviewed the triage vital signs and the nursing notes.   HISTORY  Chief Complaint Abnormal Lab and Tachycardia   HPI Abigail Gonzalez is a 52 y.o. female with PMH of HTN and iron deficiency anemia presents to the emergency department for evaluation of generalized fatigue and abnormal lab values found by the PCP today.  The patient followed up with her primary care doctor regarding the fatigue symptoms.  She has a history of heavy menstrual cycles and is currently in the middle of the menstrual cycle.  She is been feeling increased fatigue and some shortness of breath.  No chest pain.  Denies any numbness or tingling.  She is known about anemia in the past but is never received a blood transfusion.  She does not take iron supplementation.  She denies any right red blood or black in the bowel movements.   Past Medical History:  Diagnosis Date  . Hypertension     Patient Active Problem List   Diagnosis Date Noted  . Menorrhagia 04/06/2018  . Symptomatic anemia 04/06/2018  . Hypokalemia 04/06/2018  . Iron deficiency anemia due to chronic blood loss 02/01/2016  . Prediabetes 12/22/2012  . Vitamin D insufficiency 12/22/2012  . Hypertension 12/21/2012  . Anemia 12/21/2012  . Sprain of left shoulder 12/21/2012    Past Surgical History:  Procedure Laterality Date  . NO PAST SURGERIES        Allergies Patient has no known allergies.  Family History  Problem Relation Age of Onset  . Diabetes Mother   . Hypertension Father     Social History Social History   Tobacco Use  . Smoking status: Never Smoker  . Smokeless tobacco: Never Used  Substance Use Topics  . Alcohol use: No  . Drug use: No    Review of Systems  Constitutional: No fever/chills. Positive generalized weakness.  Eyes: No visual changes. ENT: No sore throat. Cardiovascular: Denies chest pain. Respiratory: Positive shortness of  breath. Gastrointestinal: No abdominal pain.  No nausea, no vomiting.  No diarrhea.  No constipation. Genitourinary: Negative for dysuria. Positive heavy vaginal bleeding (intermittent and chronic).  Musculoskeletal: Negative for back pain. Skin: Negative for rash. Neurological: Negative for headaches, focal weakness or numbness.  10-point ROS otherwise negative.  ____________________________________________   PHYSICAL EXAM:  VITAL SIGNS: ED Triage Vitals  Enc Vitals Group     BP 04/06/18 1721 (!) 150/87     Pulse Rate 04/06/18 1721 (!) 118     Resp 04/06/18 1721 16     Temp 04/06/18 1721 98.4 F (36.9 C)     Temp Source 04/06/18 1721 Oral     SpO2 04/06/18 1721 100 %     Weight 04/06/18 1722 193 lb (87.5 kg)     Height 04/06/18 1722 5\' 3"  (1.6 m)     Pain Score 04/06/18 1722 0   Constitutional: Alert and oriented. Well appearing and in no acute distress. Eyes: Conjunctivae are pale.  Head: Atraumatic. Nose: No congestion/rhinnorhea. Mouth/Throat: Mucous membranes are moist.  Neck: No stridor.   Cardiovascular: Sinus tachycardia. Good peripheral circulation. Grossly normal heart sounds.   Respiratory: Normal respiratory effort.  No retractions. Lungs CTAB. Gastrointestinal: Soft and nontender. No distention.  Musculoskeletal: No lower extremity tenderness nor edema. No gross deformities of extremities. Neurologic:  Normal speech and language. No gross focal neurologic deficits are appreciated.  Skin:  Skin is warm, dry and intact. No rash noted.  ____________________________________________  LABS (all labs ordered are listed, but only abnormal results are displayed)  Labs Reviewed  COMPREHENSIVE METABOLIC PANEL - Abnormal; Notable for the following components:      Result Value   Potassium 2.5 (*)    Glucose, Bld 126 (*)    Albumin 3.4 (*)    Anion gap 16 (*)    All other components within normal limits  CBC WITH DIFFERENTIAL/PLATELET - Abnormal; Notable for the  following components:   RBC 3.64 (*)    Hemoglobin 6.3 (*)    HCT 24.6 (*)    MCV 67.6 (*)    MCH 17.3 (*)    MCHC 25.6 (*)    RDW 20.6 (*)    All other components within normal limits  IRON AND TIBC - Abnormal; Notable for the following components:   Iron 13 (*)    Saturation Ratios 3 (*)    All other components within normal limits  FERRITIN - Abnormal; Notable for the following components:   Ferritin 5 (*)    All other components within normal limits  RETICULOCYTES - Abnormal; Notable for the following components:   RBC. 3.49 (*)    All other components within normal limits  PROTIME-INR  VITAMIN B12  FOLATE  SAVE SMEAR  T4, FREE  CBC  HIV ANTIBODY (ROUTINE TESTING)  BASIC METABOLIC PANEL  I-STAT BETA HCG BLOOD, ED ([facility], [facility], [facility] ONLY)  TYPE AND SCREEN  ABO/RH  PREPARE RBC (CROSSMATCH)   ____________________________________________  EKG   EKG Interpretation  Date/Time:  Monday April 06 2018 21:49:54 EDT Ventricular Rate:  104 PR Interval:    QRS Duration: 87 QT Interval:  304 QTC Calculation: 400 R Axis:   24 Text Interpretation:  Sinus tachycardia Multiple ventricular premature complexes Aberrant complex Abnormal R-wave progression, early transition LVH with secondary repolarization abnormality No STEMI.  Confirmed by Nanda Quinton [phone]) on 04/06/2018 9:59:38 PM       ____________________________________________  RADIOLOGY  None ____________________________________________   PROCEDURES  Procedure(s) performed:   Procedures  CRITICAL CARE Performed by: Margette Fast Total critical care time: 35 minutes Critical care time was exclusive of separately billable procedures and treating other patients. Critical care was necessary to treat or prevent imminent or life-threatening deterioration. Critical care was time spent personally by me on the following activities: development of treatment plan with patient and/or surrogate as well as nursing, discussions  with consultants, evaluation of patient's response to treatment, examination of patient, obtaining history from patient or surrogate, ordering and performing treatments and interventions, ordering and review of laboratory studies, ordering and review of radiographic studies, pulse oximetry and re-evaluation of patient's condition.  Nanda Quinton, MD Emergency Medicine  ____________________________________________   INITIAL IMPRESSION / ASSESSMENT AND PLAN / ED COURSE  Pertinent labs & imaging results that were available during my care of the patient were reviewed by me and considered in my medical decision making (see chart for details).  Patient presents to the emergency department for evaluation of symptomatic anemia.  Also found to have 2.5.  No acute EKG changes.  She is having significant fatigue and some shortness of breath.  She would benefit from overnight observation for blood transfusion and potassium replacement.  Patient has a Bryor Rami history of heavy menstrual cycles and is followed up with an OB for this.  She was offered several options but not followed up with OB in the last 2 years.  It is the source of her bleeding.   EKG reviewed with no  acute findings. Sent off smear and anemia panel.   Discussed patient's case with Hospitalist, Dr. Alcario Drought to request admission. Patient and family (if present) updated with plan. Care transferred to Hospitalist service.  I reviewed all nursing notes, vitals, pertinent old records, EKGs, labs, imaging (as available).  ____________________________________________  FINAL CLINICAL IMPRESSION(S) / ED DIAGNOSES  Final diagnoses:  Symptomatic anemia  Tachycardia  Hypokalemia     MEDICATIONS GIVEN DURING THIS VISIT:  Medications  potassium chloride 10 mEq in 100 mL IVPB (10 mEq Intravenous New Bag/Given 04/06/18 2239)  ferrous sulfate tablet 325 mg (has no administration in time range)  amLODipine (NORVASC) tablet 5 mg (has no administration  in time range)  chlorthalidone (HYGROTON) tablet 25 mg (has no administration in time range)  acetaminophen (TYLENOL) tablet 650 mg (has no administration in time range)    Or  acetaminophen (TYLENOL) suppository 650 mg (has no administration in time range)  ondansetron (ZOFRAN) tablet 4 mg (has no administration in time range)    Or  ondansetron (ZOFRAN) injection 4 mg (has no administration in time range)  0.9 %  sodium chloride infusion (10 mL/hr Intravenous New Bag/Given 04/06/18 2144)  potassium chloride SA (K-DUR,KLOR-CON) CR tablet 40 mEq (40 mEq Oral Given 04/06/18 2138)    Note:  This document was prepared using Dragon voice recognition software and may include unintentional dictation errors.  Nanda Quinton, MD Emergency Medicine    Celestial Barnfield, [facility], MD 04/06/18 [phone]

## 2018-04-06 NOTE — ED Notes (Signed)
Attempted report x1. 

## 2018-04-06 NOTE — ED Provider Notes (Signed)
Patient placed in Quick Look pathway, seen and evaluated   Chief Complaint: Fatigue  HPI:   Patient with history of iron deficiency anemia presents sent by her PCP for low hemoglobin.  Most recent CBC shows hemoglobin of 5.9.  She states that she has been feeling very fatigued and sleepy all the time, at times lightheaded.  States she can feel her heart rate increasing sometimes.  Denies syncope or shortness of breath.  No chest pains.  Dates she has heavy menstrual cycles.  ROS: Positive for fatigue, lightheadedness, palpitations Negative for chest pain, shortness of breath, syncope  Physical Exam:   Gen: No distress  Neuro: Awake and Alert  Skin: Warm    Focused Exam: Tachycardic, no murmurs rubs or gallops noted.  Lungs clear to auscultation bilaterally.  Pale palpebral conjunctivae.    Initiation of care has begun. The patient has been counseled on the process, plan, and necessity for staying for the completion/evaluation, and the remainder of the medical screening examination    Renita Papa, PA-C 04/06/18 1730    Margette Fast, MD 04/07/18 1101

## 2018-04-06 NOTE — ED Triage Notes (Signed)
Pt endorses being sent by her pcp for low iron levels and tachycardia. HR 118 in triage. Denies pain.

## 2018-04-07 DIAGNOSIS — D500 Iron deficiency anemia secondary to blood loss (chronic): Principal | ICD-10-CM

## 2018-04-07 LAB — BASIC METABOLIC PANEL
Anion gap: 9 (ref 5–15)
BUN: 9 mg/dL (ref 6–20)
CALCIUM: 8.8 mg/dL — AB (ref 8.9–10.3)
CO2: 25 mmol/L (ref 22–32)
CREATININE: 0.59 mg/dL (ref 0.44–1.00)
Chloride: 103 mmol/L (ref 98–111)
GFR calc Af Amer: >60 mL/min (ref >60)
GLUCOSE: 103 mg/dL — AB (ref 70–99)
Potassium: 3.8 mmol/L (ref 3.5–5.1)
Sodium: 137 mmol/L (ref 135–145)

## 2018-04-07 LAB — CBC
HEMATOCRIT: 32.8 % — AB (ref 36.0–46.0)
HEMOGLOBIN: 9.3 g/dL — AB (ref 12.0–15.0)
MCH: 20.5 pg — ABNORMAL LOW (ref 26.0–34.0)
MCHC: 28.4 g/dL — AB (ref 30.0–36.0)
MCV: 72.2 fL — ABNORMAL LOW (ref 78.0–100.0)
Platelets: 327 10*3/uL (ref 150–400)
RBC: 4.54 MIL/uL (ref 3.87–5.11)
RDW: 22.6 % — ABNORMAL HIGH (ref 11.5–15.5)
WBC: 8.8 10*3/uL (ref 4.0–10.5)

## 2018-04-07 LAB — T4, FREE: FREE T4: 0.78 ng/dL — AB (ref 0.82–1.77)

## 2018-04-07 MED ORDER — POTASSIUM CHLORIDE 10 MEQ/100ML IV SOLN
10.0000 meq | INTRAVENOUS | Status: AC
  Administered 2018-04-07 (×2): 10 meq via INTRAVENOUS
  Filled 2018-04-07 (×2): qty 100

## 2018-04-07 NOTE — Progress Notes (Signed)
Abigail Gonzalez to be D/C'd Home per MD order.  Discussed prescriptions and follow up appointments with the patient. Prescriptions given to patient, medication list explained in detail. Pt verbalized understanding.  Allergies as of 04/07/2018   No Known Allergies     Medication List    TAKE these medications   amLODipine 5 MG tablet Commonly known as:  NORVASC Take 1 tablet (5 mg total) by mouth daily.   chlorthalidone 25 MG tablet Commonly known as:  HYGROTON Take 1 tablet (25 mg total) by mouth daily.   ferrous sulfate 325 (65 FE) MG EC tablet Take 1 tablet (325 mg total) by mouth 3 (three) times daily with meals.   TYLENOL 500 MG tablet Generic drug:  acetaminophen Take 500 mg by mouth every 6 (six) hours as needed for mild pain.       Vitals:   04/07/18 0535 04/07/18 0820  BP: 118/73 140/82  Pulse: 84 98  Resp:  18  Temp: 98.3 F (36.8 C) 98.7 F (37.1 C)  SpO2: 100% 100%    Skin clean, dry and intact without evidence of skin break down, no evidence of skin tears noted. IV catheter discontinued intact. Site without signs and symptoms of complications. Dressing and pressure applied. Pt denies pain at this time. No complaints noted.  An After Visit Summary was printed and given to the patient. Patient walked out to [place] parking with family member, refused wheelchair, and D/C home via private auto.  [place] RN

## 2018-04-07 NOTE — Discharge Summary (Signed)
Physician Discharge Summary  Abigail Gonzalez:[id number] DOB: December 15, 1965 DOA: 04/06/2018  PCP: Gildardo Pounds, NP  Admit date: 04/06/2018 Discharge date: 04/07/2018  Time spent: 20 minutes  Recommendations for Outpatient Follow-up:  1. Will need consideration for levonorgestrel IUD versus Provera as an outpatient as she wants to consider this with her GYN 2. Please follow hemoglobin in 1 week  Discharge Diagnoses:  Principal Problem:   Iron deficiency anemia due to chronic blood loss Active Problems:   Hypertension   Menorrhagia   Symptomatic anemia   Hypokalemia   Discharge Condition: Improved  Diet recommendation: Regular  Filed Weights   04/06/18 1722  Weight: 87.5 kg (193 lb)    History of present illness:  52 year old lady with probable history of fibroids and menometrorrhagia presents with hemoglobin in the 5 range-transfused 2 units-blood count came up to 9.3 she was little hypokalemic which was followed and will need follow-up labs for the same and she was observed overnight and had no other untoward event I discussed with her options regarding me providing her a dose pack of Provera and she is more interested in speaking to her GYN and all questions answered     Discharge Exam: Vitals:   04/07/18 0535 04/07/18 0820  BP: 118/73 140/82  Pulse: 84 98  Resp:  18  Temp: 98.3 F (36.8 C) 98.7 F (37.1 C)  SpO2: 100% 100%    General: Awake alert pleasant no distress mild pallor no icterus Cardiovascular: S1-S2 no murmur rub or gallop Respiratory: Clinically clear no added sound  Discharge Instructions   Discharge Instructions    Diet - low sodium heart healthy   Complete by:  As directed    Discharge instructions   Complete by:  As directed    Please ensure close follow-up with your OB/GYN Dr. Elly Modena and discuss options regarding bleeding   Increase activity slowly   Complete by:  As directed      Allergies as of 04/07/2018   No Known Allergies     Medication List    TAKE these medications   amLODipine 5 MG tablet Commonly known as:  NORVASC Take 1 tablet (5 mg total) by mouth daily.   chlorthalidone 25 MG tablet Commonly known as:  HYGROTON Take 1 tablet (25 mg total) by mouth daily.   ferrous sulfate 325 (65 FE) MG EC tablet Take 1 tablet (325 mg total) by mouth 3 (three) times daily with meals.   TYLENOL 500 MG tablet Generic drug:  acetaminophen Take 500 mg by mouth every 6 (six) hours as needed for mild pain.      No Known Allergies    The results of significant diagnostics from this hospitalization (including imaging, microbiology, ancillary and laboratory) are listed below for reference.    Significant Diagnostic Studies: No results found.  Microbiology: No results found for this or any previous visit (from the past 240 hour(s)).   Labs: Basic Metabolic Panel: Recent Labs  Lab 04/03/18 1602 04/06/18 1724  NA 140 138  K 3.1* 2.5*  CL 99 100  CO2 25 22  GLUCOSE 161* 126*  BUN 11 10  CREATININE 0.61 0.70  CALCIUM 9.3 9.0   Liver Function Tests: Recent Labs  Lab 04/03/18 1602 04/06/18 1724  AST 13 22  ALT 13 15  ALKPHOS 81 77  BILITOT 0.3 0.6  PROT 7.3 7.6  ALBUMIN 4.0 3.4*   No results for input(s): LIPASE, AMYLASE in the last 168 hours. No results for input(s):  AMMONIA in the last 168 hours. CBC: Recent Labs  Lab 04/03/18 1602 04/06/18 1724 04/07/18 1143  WBC 8.6 6.3 8.8  NEUTROABS  --  4.3  --   HGB 5.9* 6.3* 9.3*  HCT 22.7* 24.6* 32.8*  MCV 65* 67.6* 72.2*  PLT 481* 363 327   Cardiac Enzymes: No results for input(s): CKTOTAL, CKMB, CKMBINDEX, TROPONINI in the last 168 hours. BNP: BNP (last 3 results) No results for input(s): BNP in the last 8760 hours.  ProBNP (last 3 results) No results for input(s): PROBNP in the last 8760 hours.  CBG: No results for input(s): GLUCAP in the last 168 hours.     Signed:  Nita Sells MD   [facility] 04/07/2018,  12:23 PM

## 2018-04-08 LAB — BPAM RBC
BLOOD PRODUCT EXPIRATION DATE: 201907222359
Blood Product Expiration Date: 201907272359
ISSUE DATE / TIME: 201907082304
ISSUE DATE / TIME: 201907090501
Unit Type and Rh: 6200
Unit Type and Rh: 6200

## 2018-04-08 LAB — TYPE AND SCREEN
ABO/RH(D): A POS
Antibody Screen: NEGATIVE
Unit division: 0
Unit division: 0

## 2018-04-08 LAB — HIV ANTIBODY (ROUTINE TESTING W REFLEX): HIV Screen 4th Generation wRfx: NONREACTIVE

## 2018-04-13 MED FILL — AMLODIPINE BESYLATE 5 MG TA: 5 | 30 days supply | Qty: 30 | Fill #1

## 2018-04-13 MED FILL — CHLORTHALIDONE 25 MG TAB: 25 | 30 days supply | Qty: 30 | Fill #1

## 2018-04-17 ENCOUNTER — Other Ambulatory Visit: Payer: Self-pay | Admitting: Obstetrics and Gynecology

## 2018-04-17 DIAGNOSIS — Z1231 Encounter for screening mammogram for malignant neoplasm of breast: Secondary | ICD-10-CM

## 2018-04-27 ENCOUNTER — Ambulatory Visit: Payer: Medicaid Other | Attending: Nurse Practitioner

## 2018-05-01 ENCOUNTER — Encounter: Payer: Self-pay | Admitting: Nurse Practitioner

## 2018-05-01 ENCOUNTER — Ambulatory Visit: Payer: Medicaid Other | Attending: Nurse Practitioner | Admitting: Nurse Practitioner

## 2018-05-01 VITALS — BP 144/93 | HR 108 | Temp 98.0°F | Ht 63.0 in | Wt 191.8 lb

## 2018-05-01 DIAGNOSIS — Z01419 Encounter for gynecological examination (general) (routine) without abnormal findings: Secondary | ICD-10-CM

## 2018-05-01 DIAGNOSIS — R7989 Other specified abnormal findings of blood chemistry: Secondary | ICD-10-CM

## 2018-05-01 DIAGNOSIS — Z79899 Other long term (current) drug therapy: Secondary | ICD-10-CM | POA: Insufficient documentation

## 2018-05-01 DIAGNOSIS — I10 Essential (primary) hypertension: Secondary | ICD-10-CM

## 2018-05-01 DIAGNOSIS — Z8249 Family history of ischemic heart disease and other diseases of the circulatory system: Secondary | ICD-10-CM | POA: Insufficient documentation

## 2018-05-01 NOTE — Patient Instructions (Signed)
Pap Test Why am I having this test? A pap test is sometimes called a pap smear. It is a screening test that is used to check for signs of cancer of the vagina, cervix, and uterus. The test can also identify the presence of infection or precancerous changes. Your health care provider will likely recommend you have this test done on a regular basis. This test may be done:  Every 3 years, starting at age 51.  Every 5 years, in combination with testing for the presence of human papillomavirus (HPV).  More or less often depending on other medical conditions.  What kind of sample is taken? Using a small cotton swab, plastic spatula, or brush, your health care provider will collect a sample of cells from the surface of your cervix. Your cervix is the opening to your uterus, also called a womb. Secretions from the cervix and vagina may also be collected. How do I prepare for this test?  Be aware of where you are in your menstrual cycle. You may be asked to reschedule the test if you are menstruating on the day of the test.  You may need to reschedule if you have a known vaginal infection on the day of the test.  You may be asked to avoid douching or taking a bath the day before or the day of the test.  Some medicines can cause abnormal test results, such as digitalis and tetracycline. Talk with your health care provider before your test if you take one of these medicines. What do the results mean? Abnormal test results may indicate a number of health conditions. These may include:  Cancer. Although pap test results cannot be used to diagnose cancer of the cervix, vagina, or uterus, they may suggest the possibility of cancer. Further tests would be required to determine if cancer is present.  Sexually transmitted disease.  Fungal infection.  Parasite infection.  Herpes infection.  A condition causing or contributing to infertility.  It is your responsibility to obtain your test results.  Ask the lab or department performing the test when and how you will get your results. Contact your health care provider to discuss any questions you have about your results. Talk with your health care provider to discuss your results, treatment options, and if necessary, the need for more tests. Talk with your health care provider if you have any questions about your results. This information is not intended to replace advice given to you by your health care provider. Make sure you discuss any questions you have with your health care provider. Document Released: 12/07/2002 Document Revised: 05/22/2016 Document Reviewed: 02/07/2014 Elsevier Interactive Patient Education  2018 Elsevier Inc.  Preventing Cervical Cancer Cervical cancer is cancer that grows on the cervix. The cervix is at the bottom of the uterus. It connects the uterus to the vagina. The uterus is where a baby develops during pregnancy. Cancer occurs when cells become abnormal and start to grow out of control. Cervical cancer grows slowly and may not cause any symptoms at first. Over time, the cancer can grow deep into the cervix tissue and spread to other areas. If it is found early, cervical cancer can be treated effectively. You can also take steps to prevent this type of cancer. Most cases of cervical cancer are caused by an STI (sexually transmitted infection) called human papillomavirus (HPV). One way to reduce your risk of cervical cancer is to avoid infection with the HPV virus. You can do this by practicing safe  sex and by getting the HPV vaccine. Getting regular Pap tests is also important because this can help identify changes in cells that could lead to cancer. Your chances of getting this disease can also be reduced by making certain lifestyle changes. How can I protect myself from cervical cancer? Preventing HPV infection  Ask your health care provider about getting the HPV vaccine. If you are 26 years old or younger, you may  need to get this vaccine, which is given in three doses over 6 months. This vaccine protects against the types of HPV that could cause cancer.  Limit the number of people you have sex with. Also avoid having sex with people who have had many sex partners.  Use a latex condom during sex. Getting Pap tests  Get Pap tests regularly, starting at age 20. Talk with your health care provider about how often you need these tests. ? Most women who are 92?51 years of age should have a Pap test every 3 years. ? Most women who are 40?51 years of age should have a Pap test in combination with an HPV test every 5 years. ? Women with a higher risk of cervical cancer, such as those with a weakened immune system or those who have been exposed to the drug diethylstilbestrol (DES), may need more frequent testing. Making other lifestyle changes  Do not use any products that contain nicotine or tobacco, such as cigarettes and e-cigarettes. If you need help quitting, ask your health care provider.  Eat at least 5 servings of fruits and vegetables every day.  Lose weight if you are overweight. Why are these changes important?  These changes and screening tests are designed to address the factors that are known to increase the risk of cervical cancer. Taking these steps is the best way to reduce your risk.  Having regular Pap tests will help identify changes in cells that could lead to cancer. Steps can then be taken to prevent cancer from developing.  These changes will also help find cervical cancer early. This type of cancer can be treated effectively if it is found early. It can be more dangerous and difficult to treat if cancer has grown deep into your cervix or has spread.  In addition to making you less likely to get cervical cancer, these changes will also provide other health benefits, such as the following: ? Practicing safe sex is important for preventing STIs and unplanned pregnancies. ? Avoiding  tobacco can reduce your risk for other cancers and health issues. ? Eating a healthy diet and maintaining a healthy weight are good for your overall health. What can happen if changes are not made? In the early stages, cervical cancer might not have any symptoms. It can take many years for the cancer to grow and get deep into the cervix tissue. This may be happening without you knowing about it. If you develop any symptoms, such as pelvic pain or unusual discharge or bleeding from your vagina, you should see your health care provider right away. If cervical cancer is not found early, you might need treatments such as radiation, chemotherapy, or surgery. In some cases, surgery may mean that you will not be able to get pregnant or carry a pregnancy to term. Where to find support: Talk with your health care provider, school nurse, or local health department for guidance about screening and vaccination. Some children and teens may be able to get the HPV vaccine free of charge through the U.S. government's  Vaccines for Children [facility]) program. Other places that provide vaccinations include:  Public health clinics. Check with your local health department.  [place], where you would pay only what you can afford. To find one near you, check this website: http://lyons.com/  [place]. These are part of a program for Medicare and Medicaid patients who live in rural areas.  The National Breast and Cervical Cancer Early Detection Program also provides breast and cervical cancer screenings and diagnostic services to low-income, uninsured, and underinsured women. Cervical cancer can be passed down through families. Talk with your health care provider or genetic counselor to learn more about genetic testing for cancer. Where to find more information: Learn more about cervical cancer from:  [employer]:  WirelessShades.ch  American Cancer Society: www.cancer.org/cancer/cervicalcancer/  U.S. Centers for Disease Control and Prevention: ParisianParasols.gl  Summary  Talk with your health care provider about getting the HPV vaccine.  Be sure to get regular Pap tests as recommended by your health care provider.  See your health care provider right away if you have any pelvic pain or unusual discharge or bleeding from your vagina. This information is not intended to replace advice given to you by your health care provider. Make sure you discuss any questions you have with your health care provider. Document Released: 10/01/2015 Document Revised: 05/14/2016 Document Reviewed: 05/14/2016 Elsevier Interactive Patient Education  [employer].

## 2018-05-01 NOTE — Progress Notes (Signed)
Assessment & Plan:  Abigail Gonzalez was seen today for gynecologic exam and hypertension.  Diagnoses and all orders for this visit:  Well woman exam with routine gynecological exam -     Cytology - PAP  Essential hypertension Continue all antihypertensives as prescribed.  Remember to bring in your blood pressure log with you for your follow up appointment.  DASH/Mediterranean Diets are healthier choices for HTN.  Return in 2 weeks for BP recheck  Patient has been counseled on age-appropriate routine health concerns for screening and prevention. These are reviewed and up-to-date. Referrals have been placed accordingly. Immunizations are up-to-date or declined.    Subjective:   Chief Complaint  Patient presents with  . Gynecologic Exam    Patient is here for pap smear.  . Hypertension    Pt. is here for blood pressure check.    HPI Abigail Gonzalez 52 y.o. female presents to office today for well woman exam with PAP and f/u HTN.   Essential HTN BP still elevated however she reports taking amlodipine 5mg  and chlorthalidone 25mg  fifteen minutes prior to her office visit today. Will need to recheck BP in 2 weeks. May need to increase amlodipine. Denies chest pain, shortness of breath, palpitations, lightheadedness, dizziness, headaches or BLE edema.  BP Readings from Last 3 Encounters:  05/01/18 (!) 144/93  04/07/18 140/82  04/03/18 (!) 143/87     Review of Systems  Constitutional: Negative.  Negative for chills, fever, malaise/fatigue and weight loss.  Respiratory: Negative.  Negative for cough, shortness of breath and wheezing.   Cardiovascular: Negative.  Negative for chest pain, orthopnea and leg swelling.  Gastrointestinal: Negative for abdominal pain.  Genitourinary: Negative.  Negative for flank pain.  Skin: Negative.  Negative for rash.  Psychiatric/Behavioral: Negative for suicidal ideas.    Past Medical History:  Diagnosis Date  . Hypertension     Past Surgical  History:  Procedure Laterality Date  . NO PAST SURGERIES      Family History  Problem Relation Age of Onset  . Diabetes Mother   . Hypertension Father     Social History Reviewed with no changes to be made today.   Outpatient Medications Prior to Visit  Medication Sig Dispense Refill  . acetaminophen (TYLENOL) 500 MG tablet Take 500 mg by mouth every 6 (six) hours as needed for mild pain.     Marland Kitchen amLODipine (NORVASC) 5 MG tablet Take 1 tablet (5 mg total) by mouth daily. 90 tablet 1  . chlorthalidone (HYGROTON) 25 MG tablet Take 1 tablet (25 mg total) by mouth daily. 90 tablet 1  . ferrous sulfate 325 (65 FE) MG EC tablet Take 1 tablet (325 mg total) by mouth 3 (three) times daily with meals. (Patient not taking: Reported on 04/06/2018) 90 tablet 3   No facility-administered medications prior to visit.     No Known Allergies     Objective:    BP (!) 144/93 (BP Location: Left Arm, Patient Position: Sitting, Cuff Size: Large)   Pulse (!) 108   Temp 98 F (36.7 C) (Oral)   Ht 5\' 3"  (1.6 m)   Wt 191 lb 12.8 oz (87 kg)   LMP 04/06/2018 (Exact Date)   SpO2 100%   BMI 33.98 kg/m  Wt Readings from Last 3 Encounters:  05/01/18 191 lb 12.8 oz (87 kg)  04/06/18 193 lb (87.5 kg)  04/03/18 193 lb (87.5 kg)    Physical Exam  Constitutional: She is oriented to person, place, and  time. She appears well-developed and well-nourished.  HENT:  Head: Normocephalic.  Cardiovascular: Normal rate, regular rhythm and normal heart sounds.  Pulmonary/Chest: Effort normal and breath sounds normal.  Abdominal: Soft. Bowel sounds are normal. Hernia confirmed negative in the right inguinal area and confirmed negative in the left inguinal area.  Genitourinary: Rectum normal and uterus normal. Rectal exam shows no external hemorrhoid. No labial fusion. There is no rash, tenderness, lesion or injury on the right labia. There is no rash, tenderness, lesion or injury on the left labia. Uterus is not  deviated and not enlarged. Cervix exhibits discharge. Cervix exhibits no motion tenderness and no friability. Right adnexum displays no mass, no tenderness and no fullness. Left adnexum displays no mass, no tenderness and no fullness. No erythema, tenderness or bleeding in the vagina. No foreign body in the vagina. No signs of injury around the vagina. No vaginal discharge found.  Genitourinary Comments: Labia minora and clitoris are absent  Lymphadenopathy: No inguinal adenopathy noted on the right or left side.       Right: No inguinal adenopathy present.       Left: No inguinal adenopathy present.  Neurological: She is alert and oriented to person, place, and time.  Skin: Skin is warm and dry.  Psychiatric: She has a normal mood and affect. Her behavior is normal. Judgment and thought content normal.         Patient has been counseled extensively about nutrition and exercise as well as the importance of adherence with medications and regular follow-up. The patient was given clear instructions to go to ER or return to medical center if symptoms don't improve, worsen or new problems develop. The patient verbalized understanding.   Follow-up: No follow-ups on file.   Gildardo Pounds, FNP-BC [facility] [place], [place]   05/01/2018, 9:53 AM

## 2018-05-02 LAB — T4, FREE: Free T4: 1.13 ng/dL (ref 0.82–1.77)

## 2018-05-02 LAB — TSH: TSH: 5.46 u[IU]/mL — AB (ref 0.450–4.500)

## 2018-05-02 LAB — T3: T3, Total: 115 ng/dL (ref 71–180)

## 2018-05-04 LAB — CYTOLOGY - PAP
BACTERIAL VAGINITIS: NEGATIVE
CANDIDA VAGINITIS: NEGATIVE
Chlamydia: NEGATIVE
Diagnosis: NEGATIVE
NEISSERIA GONORRHEA: NEGATIVE
TRICH (WINDOWPATH): NEGATIVE

## 2018-05-11 ENCOUNTER — Inpatient Hospital Stay (HOSPITAL_COMMUNITY)
Admission: AD | Admit: 2018-05-11 | Discharge: 2018-05-11 | Disposition: A | Discharge status: Home / Self Care | Payer: Medicaid Other | Source: Ambulatory Visit | Attending: Obstetrics and Gynecology | Admitting: Obstetrics and Gynecology

## 2018-05-11 ENCOUNTER — Encounter (HOSPITAL_COMMUNITY): Payer: Self-pay

## 2018-05-11 ENCOUNTER — Other Ambulatory Visit: Payer: Self-pay

## 2018-05-11 DIAGNOSIS — Z79899 Other long term (current) drug therapy: Secondary | ICD-10-CM | POA: Insufficient documentation

## 2018-05-11 DIAGNOSIS — I10 Essential (primary) hypertension: Secondary | ICD-10-CM | POA: Insufficient documentation

## 2018-05-11 DIAGNOSIS — N939 Abnormal uterine and vaginal bleeding, unspecified: Secondary | ICD-10-CM

## 2018-05-11 DIAGNOSIS — Z8249 Family history of ischemic heart disease and other diseases of the circulatory system: Secondary | ICD-10-CM | POA: Diagnosis not present

## 2018-05-11 LAB — URINALYSIS, ROUTINE W REFLEX MICROSCOPIC

## 2018-05-11 LAB — CBC
HCT: 32.2 % — ABNORMAL LOW (ref 36.0–46.0)
HEMOGLOBIN: 10.6 g/dL — AB (ref 12.0–15.0)
MCH: 27.4 pg (ref 26.0–34.0)
MCHC: 32.9 g/dL (ref 30.0–36.0)
MCV: 83.2 fL (ref 78.0–100.0)
Platelets: 356 10*3/uL (ref 150–400)
RBC: 3.87 MIL/uL (ref 3.87–5.11)
RDW: 22.7 % — ABNORMAL HIGH (ref 11.5–15.5)
WBC: 10 10*3/uL (ref 4.0–10.5)

## 2018-05-11 LAB — URINALYSIS, MICROSCOPIC (REFLEX)

## 2018-05-11 MED ORDER — MEGESTROL ACETATE 40 MG PO TABS: 40.0000 mg | ORAL_TABLET | Freq: Two times a day (BID) | ORAL | 0 refills | Status: AC

## 2018-05-11 MED ORDER — NORGESTIMATE-ETH ESTRADIOL 0.25-35 MG-MCG PO TABS: 1.0000 | ORAL_TABLET | Freq: Every day | ORAL | 11 refills | Status: DC

## 2018-05-11 MED FILL — MEGESTROL 40 MG TABLET: 40 | 14 days supply | Qty: 28 | Fill #0

## 2018-05-11 NOTE — MAU Provider Note (Signed)
History     CSN: [id number]  Arrival date and time: 05/11/18 0930   First Provider Initiated Contact with Patient 05/11/18 1112      Chief Complaint  Patient presents with  . Vaginal Bleeding   HPI EUPHA LOBB is a 52 y.o. G4P0010 non pregnant female who presents with vaginal bleeding. She states her normal cycle started yesterday and it is heavier than normal. She states she has some periods that are normal for her and some that are heavier. This is not a new problem and has been evaluated multiple times for the bleeding. She was told that she needed an IUD or surgery but chose to wait and see if the cycles got better. She reports saturating a pad every hour. Denies any pain, dizziness or lightheadedness. She had a blood transfusion in July due to this heavy bleeding.   OB History    Gravida  4   Para      Term      Preterm      AB  1   Living        SAB  1   TAB      Ectopic      Multiple      Live Births              Past Medical History:  Diagnosis Date  . Hypertension     Past Surgical History:  Procedure Laterality Date  . NO PAST SURGERIES      Family History  Problem Relation Age of Onset  . Diabetes Mother   . Hypertension Father     Social History   Tobacco Use  . Smoking status: Never Smoker  . Smokeless tobacco: Never Used  Substance Use Topics  . Alcohol use: No  . Drug use: No    Allergies: No Known Allergies  Medications Prior to Admission  Medication Sig Dispense Refill Last Dose  . acetaminophen (TYLENOL) 500 MG tablet Take 500 mg by mouth every 6 (six) hours as needed for mild pain.    Taking  . amLODipine (NORVASC) 5 MG tablet Take 1 tablet (5 mg total) by mouth daily. 90 tablet 1 Taking  . chlorthalidone (HYGROTON) 25 MG tablet Take 1 tablet (25 mg total) by mouth daily. 90 tablet 1 Taking  . ferrous sulfate 325 (65 FE) MG EC tablet Take 1 tablet (325 mg total) by mouth 3 (three) times daily with meals. (Patient  not taking: Reported on 04/06/2018) 90 tablet 3 Not Taking    Review of Systems  Constitutional: Negative.  Negative for fatigue and fever.  HENT: Negative.   Respiratory: Negative.  Negative for shortness of breath.   Cardiovascular: Negative.  Negative for chest pain.  Gastrointestinal: Negative.  Negative for abdominal pain, constipation, diarrhea, nausea and vomiting.  Genitourinary: Positive for vaginal bleeding. Negative for dysuria.  Neurological: Negative.  Negative for dizziness and headaches.   Physical Exam   Blood pressure 114/76, pulse (!) 111, temperature 98.3 F (36.8 C), temperature source Oral, resp. rate 18, height 5\' 3"  (1.6 m), weight 84.4 kg, last menstrual period 05/10/2018.  Patient Vitals for the past 24 hrs:  BP Temp Temp src Pulse Resp Height Weight  05/11/18 1109 114/76 - - - - - -  05/11/18 1107 129/82 - - - - - -  05/11/18 1105 (!) 139/91 - - (!) 111 - - -  05/11/18 0944 (!) 142/95 98.3 F (36.8 C) Oral (!) 134 18 5\' 3"  (1.6  m) 84.4 kg   Physical Exam  Nursing note and vitals reviewed. Constitutional: She is oriented to person, place, and time. She appears well-developed and well-nourished. No distress.  HENT:  Head: Normocephalic.  Eyes: Pupils are equal, round, and reactive to light.  Cardiovascular: Normal rate, regular rhythm and normal heart sounds.  Respiratory: Effort normal and breath sounds normal. No respiratory distress.  GI: Soft. Bowel sounds are normal. She exhibits no distension. There is no tenderness.  Neurological: She is alert and oriented to person, place, and time.  Skin: Skin is warm and dry.  Psychiatric: She has a normal mood and affect. Her behavior is normal. Judgment and thought content normal.    MAU Course  Procedures Results for orders placed or performed during the hospital encounter of 05/11/18 (from the past 24 hour(s))  Urinalysis, Routine w reflex microscopic     Status: Abnormal   Collection Time: 05/11/18 10:20  AM  Result Value Ref Range   Color, Urine RED (A) YELLOW   APPearance TURBID (A) CLEAR   Specific Gravity, Urine  1.005 - 1.030    TEST NOT REPORTED DUE TO COLOR INTERFERENCE OF URINE PIGMENT   pH  5.0 - 8.0    TEST NOT REPORTED DUE TO COLOR INTERFERENCE OF URINE PIGMENT   Glucose, UA (A) NEGATIVE mg/dL    TEST NOT REPORTED DUE TO COLOR INTERFERENCE OF URINE PIGMENT   Hgb urine dipstick (A) NEGATIVE    TEST NOT REPORTED DUE TO COLOR INTERFERENCE OF URINE PIGMENT   Bilirubin Urine (A) NEGATIVE    TEST NOT REPORTED DUE TO COLOR INTERFERENCE OF URINE PIGMENT   Ketones, ur (A) NEGATIVE mg/dL    TEST NOT REPORTED DUE TO COLOR INTERFERENCE OF URINE PIGMENT   Protein, ur (A) NEGATIVE mg/dL    TEST NOT REPORTED DUE TO COLOR INTERFERENCE OF URINE PIGMENT   Nitrite (A) NEGATIVE    TEST NOT REPORTED DUE TO COLOR INTERFERENCE OF URINE PIGMENT   Leukocytes, UA (A) NEGATIVE    TEST NOT REPORTED DUE TO COLOR INTERFERENCE OF URINE PIGMENT  Urinalysis, Microscopic (reflex)     Status: Abnormal   Collection Time: 05/11/18 10:20 AM  Result Value Ref Range   RBC / HPF >50 0 - 5 RBC/hpf   WBC, UA 0-5 0 - 5 WBC/hpf   Bacteria, UA RARE (A) NONE SEEN   Squamous Epithelial / LPF 0-5 0 - 5  CBC     Status: Abnormal   Collection Time: 05/11/18 10:28 AM  Result Value Ref Range   WBC 10.0 4.0 - 10.5 K/uL   RBC 3.87 3.87 - 5.11 MIL/uL   Hemoglobin 10.6 (L) 12.0 - 15.0 g/dL   HCT 32.2 (L) 36.0 - 46.0 %   MCV 83.2 78.0 - 100.0 fL   MCH 27.4 26.0 - 34.0 pg   MCHC 32.9 30.0 - 36.0 g/dL   RDW 22.7 (H) 11.5 - 15.5 %   Platelets 356 150 - 400 K/uL   MDM UA, UPT CBC Orthostatic vital signs Patient refused pelvic exam, states bleeding is not different.   Vital signs stable and normal Hgb- no acute processes at this time.   Assessment and Plan   1. Abnormal vaginal bleeding    -Discharge home in stable condition -Rx for megace given to patient. Discussed with patient inability to use OCPs due to  hypertension.  -Bleeding precautions discussed -Patient advised to follow-up with [facility] to discuss further options to manage bleeding.  -Patient may return  to MAU as needed or if her condition were to change or worsen  [place] 05/11/2018, 11:12 AM

## 2018-05-11 NOTE — Discharge Instructions (Signed)

## 2018-05-11 NOTE — MAU Note (Signed)
States spotting since 05-02-2018, the day after GYN exam at [facility] in [place]. Vaginal bleeding became heavy yesterday morning, saturating a pad an hour.

## 2018-05-11 NOTE — MAU Note (Addendum)
Pt C/O "bleeding a lot" since yesterday.  Having mild lower abd pain & back pain.  States she had a blood transfusion last month.

## 2018-05-14 MED FILL — ?CHLORTHALIDONE 25 MG TABLE: 25 | 30 days supply | Qty: 30 | Fill #0

## 2018-05-14 MED FILL — FERROUS SULFATE 325 MG TAB: 325 (65 FE) | 30 days supply | Qty: 90 | Fill #1

## 2018-05-15 ENCOUNTER — Encounter: Payer: Self-pay | Admitting: Pharmacist

## 2018-05-15 ENCOUNTER — Ambulatory Visit: Payer: Medicaid Other | Attending: Family Medicine | Admitting: Pharmacist

## 2018-05-15 DIAGNOSIS — Z79899 Other long term (current) drug therapy: Secondary | ICD-10-CM | POA: Insufficient documentation

## 2018-05-15 DIAGNOSIS — I10 Essential (primary) hypertension: Secondary | ICD-10-CM | POA: Insufficient documentation

## 2018-05-15 MED ORDER — AMLODIPINE BESYLATE 10 MG PO TABS: 10.0000 mg | ORAL_TABLET | Freq: Every day | ORAL | 2 refills | Status: DC

## 2018-05-15 MED ORDER — AMLODIPINE BESYLATE 5 MG PO TABS: 5.0000 mg | ORAL_TABLET | Freq: Every day | ORAL | 2 refills | Status: DC

## 2018-05-15 MED FILL — ?AMLODIPINE BESYLATE 5 MG T: 5 MG | 30 days supply | Qty: 30 | Fill #0

## 2018-05-15 NOTE — Progress Notes (Signed)
   S:    PCP: Geryl Rankins  Patient arrives in good spirits. Presents to the clinic for hypertension evaluation, counseling, and management. Patient was referred by [facility] and last seen by her on 05/01/18.    HPI: 05/01/18 visit with Zelda:  - BP elevated at 144/93.  - Pt reported compliance with amlodipine and chlorthalidone - Zelda encouraged lifestyle changes and continued medications.  - Pt was referred to me for re-check  Today, pt denies CP, SOB, HA, or dizziness. She reports adherence with medications.  Current BP Medications include:   - amlodipine 5 mg daily - chlorthalidone 25 mg daily  Dietary habits include:  - Pt limits sodium - Drinks 1 cup of tea and 1 cup of coffee daily Exercise habits include: - Patient does not exercise Family / Social history:  - Tobacco: never smoker - Alcohol: denies alcohol use  Home BP readings:  - pt does not monitor BP at home  O:  BP in clinic today:  - L arm after 5 minutes rest: 143/84; HR 118  Last 3 Office BP readings: BP Readings from Last 3 Encounters:  05/15/18 (!) 143/84  05/11/18 127/83  05/01/18 (!) 144/93   BMET    Component Value Date/Time   NA 137 04/07/2018 1143   NA 140 04/03/2018 1602   K 3.8 04/07/2018 1143   CL 103 04/07/2018 1143   CO2 25 04/07/2018 1143   GLUCOSE 103 (H) 04/07/2018 1143   BUN 9 04/07/2018 1143   BUN 11 04/03/2018 1602   CREATININE 0.59 04/07/2018 1143   CREATININE 0.58 02/01/2016 1618   CALCIUM 8.8 (L) 04/07/2018 1143   GFRNONAA >60 04/07/2018 1143   GFRNONAA >89 02/01/2016 1618   GFRAA >60 04/07/2018 1143   GFRAA >89 02/01/2016 1618    Renal function: CrCl cannot be calculated (Patient's most recent lab result is older than the maximum 21 days allowed.).  A/P: Hypertension longstanding currently uncontrolled on medications. BP Goal <130/80 mmHg. Patient is adherent with current medications. Discussed need to increase amlodipine from 5 mg to 10 mg daily. Patient insists that  she would like to wait as she does not currently exercise. Recommended 150 mins/week of aerobic exercise. Will re-check BP in 1 month. If she is still above goal, will increase amlodipine then. Patient is agreeable.   -Continued anti-hypertensives.  -Counseled on lifestyle modifications for blood pressure control including reduced dietary sodium, increased exercise, adequate sleep  Results reviewed and written information provided.   Total time in face-to-face counseling 15 minutes.   F/U Pharmacist visit 06/15/18 .    Patient seen with: Marylene Buerger, PharmD Candidate [employer] Class of 2021  Benard Halsted, PharmD, [place] [phone]

## 2018-05-15 NOTE — Patient Instructions (Addendum)
Thank you for coming to see [place] today.   Blood pressure today is improving.    Continue amlodipine 5 mg daily and chlorthalidone 25 mg daily.   Limiting salt and caffeine, as well as exercising as able for at least 30 minutes for 5 days out of the week, can also help you lower your blood pressure.  Take your blood pressure at home if you are able. Please write down these numbers and bring them to your visits.  If you have any questions about medications, please call me [phone].  Lurena Joiner

## 2018-05-28 ENCOUNTER — Ambulatory Visit (HOSPITAL_COMMUNITY)
Admission: RE | Admit: 2018-05-28 | Discharge: 2018-05-28 | Disposition: A | Discharge status: Home / Self Care | Payer: Self-pay | Source: Ambulatory Visit | Attending: Obstetrics and Gynecology | Admitting: Obstetrics and Gynecology

## 2018-05-28 ENCOUNTER — Ambulatory Visit
Admission: RE | Admit: 2018-05-28 | Discharge: 2018-05-28 | Disposition: A | Discharge status: Home / Self Care | Payer: No Typology Code available for payment source | Source: Ambulatory Visit | Attending: Obstetrics and Gynecology | Admitting: Obstetrics and Gynecology

## 2018-05-28 ENCOUNTER — Encounter (HOSPITAL_COMMUNITY): Payer: Self-pay

## 2018-05-28 VITALS — BP 152/94 | Ht 62.0 in | Wt 191.2 lb

## 2018-05-28 DIAGNOSIS — Z1231 Encounter for screening mammogram for malignant neoplasm of breast: Secondary | ICD-10-CM

## 2018-05-28 DIAGNOSIS — Z1239 Encounter for other screening for malignant neoplasm of breast: Secondary | ICD-10-CM

## 2018-05-28 NOTE — Progress Notes (Signed)
No complaints today.   Pap Smear: Pap smear not completed today. Last Pap smear was 05/01/2018 at [facility] and normal. Per patient has no history of an abnormal Pap smear. Last Pap smear result is in Epic.  Physical exam: Breasts Breasts symmetrical. No skin abnormalities bilateral breasts. No nipple retraction bilateral breasts. No nipple discharge bilateral breasts. No lymphadenopathy. No lumps palpated bilateral breasts. No complaints of pain or tenderness on exam. Referred patient to the [facility] for a screening mammogram. Appointment scheduled for Thursday, May 28, 2018 at 0910.        Pelvic/Bimanual No Pap smear completed today since last Pap smear was 05/01/2018. Pap smear not indicated per BCCCP guidelines.   Smoking History: Patient has never smoked.  Patient Navigation: Patient education provided. Access to services provided for patient through [facility] program.   Colorectal Cancer Screening: Per patient has never had a colonoscopy completed. No complaints today. FIT Test given to patient to complete and return to BCCCP.  Breast and Cervical Cancer Risk Assessment: Patient has no family history of breast cancer, known genetic mutations, or radiation treatment to the chest before age 38. Patient has no history of cervical dysplasia, immunocompromised, or DES exposure in-utero.  Risk Assessment    Risk Scores      05/28/2018   Last edited by: Armond Hang, LPN   5-year risk: 1.2 %   Lifetime risk: 8.5 %

## 2018-05-28 NOTE — Patient Instructions (Signed)
Explained breast self awareness with Laural Roes. Patient did not need a Pap smear today due to last Pap smear was 05/01/2018. Let her know BCCCP will cover Pap smears every 3 years unless has a history of abnormal Pap smears. Referred patient to the [facility] for a screening mammogram. Appointment scheduled for Thursday, May 28, 2018 at 0910. Let [facility] know the [facility] will follow up with her within the next couple weeks with results of mammogram by letter or phone. [place] verbalized understanding.  Aminah Zabawa, Arvil Chaco, RN 8:31 AM

## 2018-05-29 ENCOUNTER — Encounter (HOSPITAL_COMMUNITY): Payer: Self-pay | Admitting: *Deleted

## 2018-06-05 ENCOUNTER — Ambulatory Visit: Payer: Self-pay | Admitting: Obstetrics & Gynecology

## 2018-06-05 ENCOUNTER — Encounter: Payer: Self-pay | Admitting: Obstetrics & Gynecology

## 2018-06-05 VITALS — BP 165/88 | HR 130 | Wt 186.9 lb

## 2018-06-05 DIAGNOSIS — D500 Iron deficiency anemia secondary to blood loss (chronic): Secondary | ICD-10-CM

## 2018-06-05 DIAGNOSIS — N938 Other specified abnormal uterine and vaginal bleeding: Secondary | ICD-10-CM | POA: Diagnosis present

## 2018-06-05 MED ORDER — MEGESTROL ACETATE 40 MG PO TABS: 40.0000 mg | ORAL_TABLET | Freq: Two times a day (BID) | ORAL | 5 refills | Status: DC

## 2018-06-05 MED FILL — MEGESTROL 40 MG TABLET: 40 | 30 days supply | Qty: 60 | Fill #0

## 2018-06-05 NOTE — Progress Notes (Signed)
   Subjective:    Patient ID: Abigail Gonzalez, female    DOB: [birth year], 52 y.o.   MRN: [id number]  HPI 52 yo married P3 (21, 51, and 55 yo kids) here with the issue of DUB. She had a hbg of 9.3 on 7/19. She has had several ER/MAU visits for this issue.  Her husband lives in [place] and sees him about 3 months. She does not use contraception. She had an u/s in 2016 that was normal.   Review of Systems She works at [employer] in [employer].  She is from [place], has been for 19 years.    She only took her BP meds just before her visit today.  Objective:   Physical Exam Breathing, conversing, and ambulating normally Morbid obesity, well hydrated African female, no apparent distress     Assessment & Plan:  Dub- schedule gyn u/s Continue iron pills EMBX at next visit after gyn u/s megace

## 2018-06-08 ENCOUNTER — Encounter: Payer: Self-pay | Admitting: Obstetrics & Gynecology

## 2018-06-10 ENCOUNTER — Ambulatory Visit (HOSPITAL_COMMUNITY)
Admission: RE | Admit: 2018-06-10 | Discharge: 2018-06-10 | Disposition: A | Discharge status: Home / Self Care | Payer: Medicaid Other | Source: Ambulatory Visit | Attending: Obstetrics & Gynecology | Admitting: Obstetrics & Gynecology

## 2018-06-10 DIAGNOSIS — N938 Other specified abnormal uterine and vaginal bleeding: Secondary | ICD-10-CM | POA: Diagnosis present

## 2018-06-10 DIAGNOSIS — R9389 Abnormal findings on diagnostic imaging of other specified body structures: Secondary | ICD-10-CM | POA: Insufficient documentation

## 2018-06-10 DIAGNOSIS — D251 Intramural leiomyoma of uterus: Secondary | ICD-10-CM | POA: Insufficient documentation

## 2018-06-12 ENCOUNTER — Telehealth: Payer: Self-pay | Admitting: Nurse Practitioner

## 2018-06-12 NOTE — Telephone Encounter (Signed)
  Patient stated that she wants her u/s results

## 2018-06-15 ENCOUNTER — Encounter: Payer: Self-pay | Admitting: Pharmacist

## 2018-06-15 ENCOUNTER — Ambulatory Visit: Payer: Medicaid Other | Attending: Family Medicine | Admitting: Pharmacist

## 2018-06-15 VITALS — BP 163/84 | HR 103

## 2018-06-15 DIAGNOSIS — I10 Essential (primary) hypertension: Secondary | ICD-10-CM | POA: Diagnosis not present

## 2018-06-15 DIAGNOSIS — E876 Hypokalemia: Secondary | ICD-10-CM | POA: Diagnosis not present

## 2018-06-15 DIAGNOSIS — R7303 Prediabetes: Secondary | ICD-10-CM | POA: Insufficient documentation

## 2018-06-15 MED ORDER — AMLODIPINE BESYLATE 10 MG PO TABS: 10.0000 mg | ORAL_TABLET | Freq: Every day | ORAL | 3 refills | Status: DC

## 2018-06-15 MED FILL — AMLODIPINE BESYLATE 10 MG T: 10 | 30 days supply | Qty: 30 | Fill #0

## 2018-06-15 MED FILL — ?CHLORTHALIDONE 25 MG TABLE: 25 | 30 days supply | Qty: 30 | Fill #1

## 2018-06-15 NOTE — Telephone Encounter (Signed)
Pt came to front office and I informed her that her results and the procedure will be done on her visit on 07/01/18.  Pt stated understanding.

## 2018-06-15 NOTE — Patient Instructions (Signed)
Thank you for coming to see Abigail Gonzalez today.   Blood pressure today is elevated.  Start taking amlodipine 10 mg before bed.   Continue taking chlorthalidone 25 mg in the morning.   Limiting salt and caffeine, as well as exercising as able for at least 30 minutes for 5 days out of the week, can also help you lower your blood pressure.  Take your blood pressure at home if you are able. Please write down these numbers and bring them to your visits.  If you have any questions about medications, please call me [phone].  Lurena Joiner

## 2018-06-15 NOTE — Progress Notes (Signed)
   S:    PCP: Geryl Rankins  Patient arrives in good spirits. Presents to the clinic for hypertension management. Patient was referred by [facility] and last seen by her on 05/01/18.  I last saw the patient on 05/15/18. BP was elevated but patient wished to continue with lifestyle and not make changes to medications at that time.   Today, she denies CP, SOB, HA, or blurred vision. She reports adherence with medications.  Current BP Medications include:   - amlodipine 5 mg daily - chlorthalidone 25 mg daily  Dietary habits include:  - Pt limits sodium - Drinks 1 cup of tea and 1 cup of coffee daily Exercise habits include: - Patient does not exercise Family / Social history:  - Tobacco: never smoker - Alcohol: denies alcohol use  Home BP readings:  - pt does not monitor BP at home  O:  BP in clinic today:  - L arm after 5 minutes rest: 163/84; HR 103  Last 3 Office BP readings: BP Readings from Last 3 Encounters:  06/15/18 (!) 163/84  06/05/18 (!) 165/88  05/28/18 (!) 152/94   BMET    Component Value Date/Time   NA 137 04/07/2018 1143   NA 140 04/03/2018 1602   K 3.8 04/07/2018 1143   CL 103 04/07/2018 1143   CO2 25 04/07/2018 1143   GLUCOSE 103 (H) 04/07/2018 1143   BUN 9 04/07/2018 1143   BUN 11 04/03/2018 1602   CREATININE 0.59 04/07/2018 1143   CREATININE 0.58 02/01/2016 1618   CALCIUM 8.8 (L) 04/07/2018 1143   GFRNONAA >60 04/07/2018 1143   GFRNONAA >89 02/01/2016 1618   GFRAA >60 04/07/2018 1143   GFRAA >89 02/01/2016 1618    Renal function: CrCl cannot be calculated (Patient's most recent lab result is older than the maximum 21 days allowed.).  A/P: Hypertension longstanding currently uncontrolled on medications. BP Goal <130/80 mmHg. Patient is adherent with current medications. Discussed need to increase amlodipine from 5 mg to 10 mg daily. She is amenable to the increase.  Of note, pt with history of hypokalemia and pre-diabetes. Will re-check of labs at  next encounter with me.  -Increase amlodipine from 5 to 10 mg daily -Continue chlorthalidone 25 mg daily   -Counseled on lifestyle modifications for blood pressure control including reduced dietary sodium, increased exercise, adequate sleep  Results reviewed and written information provided.   Total time in face-to-face counseling 15 minutes.   F/U Pharmacist visit 06/15/18 .    Patient seen with: Marylene Buerger, PharmD Candidate [employer] Class of 2021  Benard Halsted, PharmD, [place] [phone]

## 2018-07-01 ENCOUNTER — Encounter: Payer: Self-pay | Admitting: Obstetrics & Gynecology

## 2018-07-01 ENCOUNTER — Ambulatory Visit (INDEPENDENT_AMBULATORY_CARE_PROVIDER_SITE_OTHER): Payer: Medicaid Other | Admitting: Obstetrics & Gynecology

## 2018-07-01 ENCOUNTER — Other Ambulatory Visit (HOSPITAL_COMMUNITY)
Admission: RE | Admit: 2018-07-01 | Discharge: 2018-07-01 | Disposition: A | Discharge status: Home / Self Care | Payer: Medicaid Other | Source: Ambulatory Visit | Attending: Obstetrics & Gynecology | Admitting: Obstetrics & Gynecology

## 2018-07-01 ENCOUNTER — Encounter: Payer: Self-pay | Admitting: *Deleted

## 2018-07-01 VITALS — BP 148/92 | HR 136 | Ht 62.0 in | Wt 187.1 lb

## 2018-07-01 DIAGNOSIS — N938 Other specified abnormal uterine and vaginal bleeding: Secondary | ICD-10-CM | POA: Diagnosis not present

## 2018-07-01 DIAGNOSIS — D500 Iron deficiency anemia secondary to blood loss (chronic): Secondary | ICD-10-CM

## 2018-07-01 DIAGNOSIS — Z3202 Encounter for pregnancy test, result negative: Secondary | ICD-10-CM

## 2018-07-01 LAB — POCT PREGNANCY, URINE: PREG TEST UR: NEGATIVE

## 2018-07-01 NOTE — Progress Notes (Signed)
   Subjective:    Patient ID: Abigail Gonzalez, female    DOB: April 17, 1966, 52 y.o.   MRN: [id number]  HPI 52 yo married P3 here for [facility] due to DUB. Her u/s showed a slightly thickened endometrium.    Review of Systems Her hbg was 10.6 She does not use contraception.    Objective:   Physical Exam Breathing, conversing, and ambulating normally Morbidly obese,  well hydrated female, no apparent distress  UPT negative, consent signed, time out done Cervix prepped with betadine and grasped with a single tooth tenaculum Uterus sounded to 9 cm Pipelle used for 6 passes with a moderate amount of tissue obtained. The first 4 passes were just darkish blood. She tolerated the procedure well.     Assessment & Plan:  DUB- embx Anemia- rec over the counter iron BID/stool softener prn Come back for treatment plan in 2 weeks She declines the flu shot today, may get it at her next visit.

## 2018-07-01 NOTE — Addendum Note (Signed)
Addended by: Emily Filbert on: 07/01/2018 10:45 AM   Modules accepted: Level of Service

## 2018-07-01 NOTE — Progress Notes (Signed)
States did not take bp medicine yet today.

## 2018-07-07 MED FILL — MEGESTROL 40 MG TABLET: 40 | 30 days supply | Qty: 60 | Fill #1

## 2018-07-13 ENCOUNTER — Telehealth: Payer: Self-pay | Admitting: Nurse Practitioner

## 2018-07-13 MED FILL — ?CHLORTHALIDONE 25 MG TABLE: 25 | 30 days supply | Qty: 30 | Fill #2

## 2018-07-13 NOTE — Telephone Encounter (Signed)
Pt has plenty of refills with the pharmacy, I submitted this refill next time he should contact the pharmacy directly

## 2018-07-13 NOTE — Telephone Encounter (Signed)
1) Medication(s) Requested (by name): [place]  2) Pharmacy of Choice: [email or web address] 3) Special Requests:   Approved medications will be sent to the pharmacy, we will reach out if there is an issue.  Requests made after 3pm may not be addressed until the following business day!  If a patient is unsure of the name of the medication(s) please note and ask patient to call back when they are able to provide all info, do not send to responsible party until all information is available!

## 2018-07-14 MED FILL — AMLODIPINE BESYLATE 10 MG T: 10 | 30 days supply | Qty: 30 | Fill #1

## 2018-07-15 ENCOUNTER — Ambulatory Visit: Payer: Medicaid Other | Attending: Nurse Practitioner | Admitting: Pharmacist

## 2018-07-15 ENCOUNTER — Encounter: Payer: Self-pay | Admitting: Pharmacist

## 2018-07-15 VITALS — BP 138/83 | HR 93

## 2018-07-15 DIAGNOSIS — Z79899 Other long term (current) drug therapy: Secondary | ICD-10-CM | POA: Insufficient documentation

## 2018-07-15 DIAGNOSIS — I10 Essential (primary) hypertension: Secondary | ICD-10-CM | POA: Diagnosis not present

## 2018-07-15 NOTE — Progress Notes (Signed)
   S:    PCP: Geryl Rankins  Patient arrives in good spirits. Presents to the clinic for hypertension management. Patient was referred by [facility] and last seen by her on 05/01/18.  I last saw the patient on 06/15/18. BP was 163/84. Amlodipine was increased to 10 mg daily.   Today, she denies CP, SOB, HA, or blurred vision. She reports adherence with medications.  Current BP Medications include:   - amlodipine 10 mg daily - chlorthalidone 25 mg daily  Dietary habits include:  - Pt limits sodium - Drinks 1 cup of tea and 1 cup of coffee daily Exercise habits include: - Patient reports increase in exercise however has limited this week d/t weather Family / Social history:  - Tobacco: never smoker - Alcohol: denies alcohol use  Home BP readings:  - pt reports occasional BP measurement at home  - took this morning: reports 130s/90s.  O:  BP in clinic today:  - L arm after 5 minutes rest: 138/83; HR 93  Last 3 Office BP readings: BP Readings from Last 3 Encounters:  07/01/18 (!) 148/92  06/15/18 (!) 163/84  06/05/18 (!) 165/88   BMET    Component Value Date/Time   NA 137 04/07/2018 1143   NA 140 04/03/2018 1602   K 3.8 04/07/2018 1143   CL 103 04/07/2018 1143   CO2 25 04/07/2018 1143   GLUCOSE 103 (H) 04/07/2018 1143   BUN 9 04/07/2018 1143   BUN 11 04/03/2018 1602   CREATININE 0.59 04/07/2018 1143   CREATININE 0.58 02/01/2016 1618   CALCIUM 8.8 (L) 04/07/2018 1143   GFRNONAA >60 04/07/2018 1143   GFRNONAA >89 02/01/2016 1618   GFRAA >60 04/07/2018 1143   GFRAA >89 02/01/2016 1618   Renal function: CrCl cannot be calculated (Patient's most recent lab result is older than the maximum 21 days allowed.).  A/P: Hypertension longstanding currently uncontrolled but improved on medications. BP Goal <130/80 mmHg. Patient is adherent with current medications. She has not taken medications today and is close to goal. Will continue for now. She will have labs done  07/20/18.  -Continue amlodipine 10 mg daily -Continue chlorthalidone 25 mg daily   -Counseled on lifestyle modifications for blood pressure control including reduced dietary sodium, increased exercise, adequate sleep  Results reviewed and written information provided. Total time in face-to-face counseling 15 minutes.   F/u with PCP.   Patient seen with: Dixon Boos, PharmD Candidate [employer] Class of 2021  Benard Halsted, PharmD, [place] [phone]

## 2018-07-15 NOTE — Patient Instructions (Addendum)
Thank you for coming to see [place] today.   Blood pressure today is improving.  Continue taking blood pressure medications as prescribed.   Go by the pharmacy to pick up your refills.Please make a labs only appointment for next week.   Limiting salt and caffeine, as well as exercising as able for at least 30 minutes for 5 days out of the week, can also help you lower your blood pressure.  Take your blood pressure at home if you are able. Please write down these numbers and bring them to your visits.  If you have any questions about medications, please call me [phone].  Lurena Joiner

## 2018-07-17 ENCOUNTER — Ambulatory Visit (INDEPENDENT_AMBULATORY_CARE_PROVIDER_SITE_OTHER): Payer: Medicaid Other | Admitting: Obstetrics & Gynecology

## 2018-07-17 ENCOUNTER — Encounter: Payer: Self-pay | Admitting: Obstetrics & Gynecology

## 2018-07-17 VITALS — BP 162/80 | HR 126 | Ht 62.0 in | Wt 192.6 lb

## 2018-07-17 DIAGNOSIS — D500 Iron deficiency anemia secondary to blood loss (chronic): Secondary | ICD-10-CM

## 2018-07-17 DIAGNOSIS — N938 Other specified abnormal uterine and vaginal bleeding: Secondary | ICD-10-CM | POA: Diagnosis not present

## 2018-07-17 NOTE — Progress Notes (Signed)
   Subjective:    Patient ID: Abigail Gonzalez, female    DOB: 1965-12-17, 52 y.o.   MRN: [id number]  HPI 52 yo married P3 here for follow up after a EMBX done for DUB that has been going on for about 10 years. She saw her primary care doc yesterday. She says that she didn't take her BP meds today.  She is currently taking megace BID. She says that the amount of bleeding is less but not the frequency.   She does not use contraception, would be ok with a pregnancy. She does not keep track of her bleeding.  She thinks that she had a d&c in 2013 or 2014.  Review of Systems     Objective:   Physical Exam Breathing, conversing, and ambulating normally Morbidly obese, well hydrated Black female, no apparent distress Abd- benign     Assessment & Plan:  DUB- cannot treat with OCPs due to her uncontrolled HNT I offered her a d&c but she declines She declines TDAP We discussed a [place] She and her husband wish to think about her options  She will call and schedule an appt when she has made a decision I have strongly encouraged her to see her doctor about her HTN.

## 2018-07-17 NOTE — Progress Notes (Signed)
States has not taken bp med yet today. BP elevated today.

## 2018-07-20 ENCOUNTER — Ambulatory Visit: Payer: Medicaid Other | Attending: Family Medicine

## 2018-07-20 DIAGNOSIS — I10 Essential (primary) hypertension: Secondary | ICD-10-CM

## 2018-07-21 LAB — COMPREHENSIVE METABOLIC PANEL
A/G RATIO: 1.4 (ref 1.2–2.2)
ALK PHOS: 48 IU/L (ref 39–117)
ALT: 29 IU/L (ref 0–32)
AST: 21 IU/L (ref 0–40)
Albumin: 4.2 g/dL (ref 3.5–5.5)
BUN/Creatinine Ratio: 18 (ref 9–23)
BUN: 13 mg/dL (ref 6–24)
Bilirubin Total: 0.2 mg/dL (ref 0.0–1.2)
CALCIUM: 10.3 mg/dL — AB (ref 8.7–10.2)
CO2: 26 mmol/L (ref 20–29)
Chloride: 101 mmol/L (ref 96–106)
Creatinine, Ser: 0.74 mg/dL (ref 0.57–1.00)
GFR calc Af Amer: 108 mL/min/{1.73_m2} (ref >59)
GFR, EST NON AFRICAN AMERICAN: 94 mL/min/{1.73_m2} (ref >59)
Globulin, Total: 3.1 g/dL (ref 1.5–4.5)
Glucose: 96 mg/dL (ref 65–99)
POTASSIUM: 3.8 mmol/L (ref 3.5–5.2)
SODIUM: 141 mmol/L (ref 134–144)
Total Protein: 7.3 g/dL (ref 6.0–8.5)

## 2018-07-22 ENCOUNTER — Telehealth: Payer: Self-pay | Admitting: Nurse Practitioner

## 2018-08-01 ENCOUNTER — Ambulatory Visit: Payer: Self-pay | Admitting: Adult Health

## 2018-08-01 ENCOUNTER — Encounter: Payer: Self-pay | Admitting: Adult Health

## 2018-08-01 ENCOUNTER — Ambulatory Visit: Payer: Self-pay | Admitting: Internal Medicine

## 2018-08-01 VITALS — BP 137/86 | HR 111 | Temp 98.6°F | Ht 63.0 in | Wt 193.3 lb

## 2018-08-01 VITALS — BP 150/90 | HR 122 | Temp 98.3°F | Resp 18 | Ht 63.6 in | Wt 183.0 lb

## 2018-08-01 DIAGNOSIS — I10 Essential (primary) hypertension: Secondary | ICD-10-CM

## 2018-08-01 DIAGNOSIS — H539 Unspecified visual disturbance: Secondary | ICD-10-CM

## 2018-08-01 NOTE — Progress Notes (Signed)
  A&P:  1. Vision changes Patient has noted changes in vision. She has not had eye exam in > 2 years. Referral made to [facility]   HPI: 1. Would like to be seen by eye doctor  ROS:  Eye changes

## 2018-08-01 NOTE — Patient Instructions (Signed)
  You are being referred to the [facility]

## 2018-08-13 MED FILL — AMLODIPINE BESYLATE 10 MG T: 10 | 30 days supply | Qty: 30 | Fill #2

## 2018-08-13 MED FILL — ?CHLORTHALIDONE 25 MG TABLE: 25 | 30 days supply | Qty: 30 | Fill #3

## 2018-08-18 ENCOUNTER — Encounter: Payer: Self-pay | Admitting: Internal Medicine

## 2018-09-07 ENCOUNTER — Encounter: Payer: Self-pay | Admitting: Nurse Practitioner

## 2018-09-07 ENCOUNTER — Ambulatory Visit: Payer: Medicaid Other | Attending: Nurse Practitioner | Admitting: Nurse Practitioner

## 2018-09-07 VITALS — BP 153/89 | HR 115 | Temp 97.9°F | Ht 63.0 in | Wt 196.0 lb

## 2018-09-07 DIAGNOSIS — B078 Other viral warts: Secondary | ICD-10-CM

## 2018-09-07 DIAGNOSIS — R2232 Localized swelling, mass and lump, left upper limb: Secondary | ICD-10-CM | POA: Diagnosis not present

## 2018-09-07 DIAGNOSIS — I10 Essential (primary) hypertension: Secondary | ICD-10-CM | POA: Insufficient documentation

## 2018-09-07 DIAGNOSIS — Z79899 Other long term (current) drug therapy: Secondary | ICD-10-CM | POA: Insufficient documentation

## 2018-09-07 MED ORDER — SALICYLIC ACID 3 % EX OINT: 1.0000 "application " | TOPICAL_OINTMENT | Freq: Every day | CUTANEOUS | 0 refills | Status: AC

## 2018-09-07 MED ORDER — MUPIROCIN 2 % EX OINT: 1.0000 "application " | TOPICAL_OINTMENT | Freq: Two times a day (BID) | CUTANEOUS | 1 refills | Status: DC

## 2018-09-07 NOTE — Progress Notes (Signed)
Assessment & Plan:  Abigail Gonzalez was seen today for cyst.  Diagnoses and all orders for this visit:  Other viral warts -     Salicylic Acid 3 % OINT; Apply 1 application topically daily.   Patient has been counseled on age-appropriate routine health concerns for screening and prevention. These are reviewed and up-to-date. Referrals have been placed accordingly. Immunizations are up-to-date or declined.    Subjective:   Chief Complaint  Patient presents with  . Cyst    left hand   HPI Abigail Gonzalez 52 y.o. female presents to office today with complaints of hardened growth between the left 3rd and 4th fingers. Onset several months ago.  Previous treatment has included nothing.   Review of Systems  Constitutional: Negative for fever, malaise/fatigue and weight loss.  HENT: Negative.  Negative for nosebleeds.   Eyes: Negative.  Negative for blurred vision, double vision and photophobia.  Respiratory: Negative.  Negative for cough and shortness of breath.   Cardiovascular: Negative.  Negative for chest pain, palpitations and leg swelling.  Gastrointestinal: Negative.  Negative for heartburn, nausea and vomiting.  Musculoskeletal: Negative.  Negative for myalgias.  Skin: Positive for rash.       SEE HPI  Neurological: Negative.  Negative for dizziness, focal weakness, seizures and headaches.  Psychiatric/Behavioral: Negative.  Negative for suicidal ideas.    Past Medical History:  Diagnosis Date  . Hypertension     Past Surgical History:  Procedure Laterality Date  . NO PAST SURGERIES      Family History  Problem Relation Age of Onset  . Diabetes Mother   . Hypertension Father     Social History Reviewed with no changes to be made today.   Outpatient Medications Prior to Visit  Medication Sig Dispense Refill  . acetaminophen (TYLENOL) 500 MG tablet Take 500 mg by mouth every 6 (six) hours as needed for mild pain.     Marland Kitchen amLODipine (NORVASC) 10 MG tablet Take 1  tablet (10 mg total) by mouth daily. 90 tablet 3  . ferrous sulfate 325 (65 FE) MG EC tablet Take 1 tablet (325 mg total) by mouth 3 (three) times daily with meals. 90 tablet 3  . chlorthalidone (HYGROTON) 25 MG tablet Take 1 tablet (25 mg total) by mouth daily. 90 tablet 1  . megestrol (MEGACE) 40 MG tablet Take 1 tablet (40 mg total) by mouth 2 (two) times daily. (Patient not taking: Reported on 09/07/2018) 60 tablet 5   No facility-administered medications prior to visit.     No Known Allergies     Objective:    BP (!) 153/89   Pulse (!) 115   Temp 97.9 F (36.6 C) (Oral)   Ht 5\' 3"  (1.6 m)   Wt 196 lb (88.9 kg)   SpO2 100%   BMI 34.72 kg/m  Wt Readings from Last 3 Encounters:  09/07/18 196 lb (88.9 kg)  08/01/18 193 lb 5.5 oz (87.7 kg)  08/01/18 183 lb (83 kg)    Physical Exam  Constitutional: She is oriented to person, place, and time. She appears well-developed and well-nourished. She is cooperative.  HENT:  Head: Normocephalic and atraumatic.  Eyes: EOM are normal.  Neck: Normal range of motion.  Cardiovascular: Normal rate, regular rhythm, normal heart sounds and intact distal pulses. Exam reveals no gallop and no friction rub.  No murmur heard. Pulmonary/Chest: Effort normal and breath sounds normal. No tachypnea. No respiratory distress. She has no decreased breath sounds. She has  no wheezes. She has no rhonchi. She has no rales. She exhibits no tenderness.  Abdominal: Soft. Bowel sounds are normal.  Musculoskeletal: Normal range of motion. She exhibits no edema.       Hands: Neurological: She is alert and oriented to person, place, and time. Coordination normal.  Skin: Skin is warm and dry.  Psychiatric: She has a normal mood and affect. Her behavior is normal. Judgment and thought content normal.  Nursing note and vitals reviewed.      Patient has been counseled extensively about nutrition and exercise as well as the importance of adherence with medications  and regular follow-up. The patient was given clear instructions to go to ER or return to medical center if symptoms don't improve, worsen or new problems develop. The patient verbalized understanding.   Follow-up: Return if symptoms worsen or fail to improve.   Abigail Pounds, FNP-BC [facility] [place], [place]   09/07/2018, 5:08 PM

## 2018-09-07 NOTE — Patient Instructions (Signed)
Warts Warts are small growths on the skin. They are common, and they are caused by a type of germ (virus). Warts can occur on many areas of the body. A person may have one wart or more than one wart. Warts can spread if you scratch a wart and then scratch normal skin. Most warts will go away over many months to a couple years. Treatments may be done if needed. Follow these instructions at home:  Apply over-the-counter and prescription medicines only as told by your doctor.  Do not apply over-the-counter wart medicines to your face or genitals before you ask your doctor if it is okay to do that.  Do not scratch or pick at a wart.  Wash your hands after you touch a wart.  Avoid shaving hair that is over a wart.  Keep all follow-up visits as told by your doctor. This is important. Contact a doctor if:  Your warts do not improve after treatment.  You have redness, swelling, or pain at the site of a wart.  You have bleeding from a wart, and the bleeding does not stop when you put light pressure on the wart.  You have diabetes and you get a wart. This information is not intended to replace advice given to you by your health care provider. Make sure you discuss any questions you have with your health care provider. Document Released: 01/17/2011 Document Revised: 02/22/2016 Document Reviewed: 12/12/2014 Elsevier Interactive Patient Education  [employer].

## 2018-09-14 MED FILL — AMLODIPINE BESYLATE 10 MG T: 10 | 30 days supply | Qty: 30 | Fill #3

## 2018-09-14 MED FILL — ?CHLORTHALIDONE 25 MG TABLE: 25 | 30 days supply | Qty: 30 | Fill #4

## 2018-10-14 MED FILL — ?CHLORTHALIDONE 25 MG TABLE: 25 | 30 days supply | Qty: 30 | Fill #5

## 2018-10-14 MED FILL — AMLODIPINE BESYLATE 10 MG T: 10 | 30 days supply | Qty: 30 | Fill #4

## 2018-11-17 ENCOUNTER — Other Ambulatory Visit: Payer: Self-pay | Admitting: Nurse Practitioner

## 2018-11-17 DIAGNOSIS — I10 Essential (primary) hypertension: Secondary | ICD-10-CM

## 2018-11-17 MED FILL — AMLODIPINE BESYLATE 10 MG T: 10 | 30 days supply | Qty: 30 | Fill #5

## 2018-11-19 MED FILL — CHLORTHALIDONE 25 MG TAB: 25 | 30 days supply | Qty: 30 | Fill #0

## 2018-11-29 NOTE — Addendum Note (Signed)
Addended by: Toma Deiters on: 11/29/2018 01:26 PM   Modules accepted: Orders

## 2018-12-21 MED FILL — AMLODIPINE BESYLATE 10 MG T: 10 | 30 days supply | Qty: 30 | Fill #6

## 2018-12-21 MED FILL — CHLORTHALIDONE 25 MG TAB: 25 | 30 days supply | Qty: 30 | Fill #1

## 2019-01-09 NOTE — Telephone Encounter (Signed)
error 

## 2019-01-14 ENCOUNTER — Ambulatory Visit: Payer: Self-pay | Admitting: Ophthalmology

## 2019-01-19 MED FILL — CHLORTHALIDONE 25 MG TAB: 25 | 30 days supply | Qty: 30 | Fill #2

## 2019-01-19 MED FILL — AMLODIPINE BESYLATE 10 MG T: 10 | 30 days supply | Qty: 30 | Fill #7

## 2019-02-16 ENCOUNTER — Other Ambulatory Visit: Payer: Self-pay | Admitting: Nurse Practitioner

## 2019-02-16 DIAGNOSIS — I10 Essential (primary) hypertension: Secondary | ICD-10-CM

## 2019-02-16 MED FILL — CHLORTHALIDONE 25 MG TAB: 25 | 30 days supply | Qty: 30 | Fill #0

## 2019-02-16 MED FILL — AMLODIPINE BESYLATE 10 MG T: 10 | 30 days supply | Qty: 30 | Fill #8

## 2019-03-23 MED FILL — ?AMLODIPINE BESYLATE 10 MG: 10 | 30 days supply | Qty: 30 | Fill #9

## 2019-03-23 MED FILL — ?CHLORTHALIDONE 25 MG TABLE: 25 | 30 days supply | Qty: 30 | Fill #1

## 2019-04-27 MED FILL — ?AMLODIPINE BESYLATE 10 MG: 10 | 30 days supply | Qty: 30 | Fill #10

## 2019-04-27 MED FILL — ?CHLORTHALIDONE 25 MG TABLE: 25 | 30 days supply | Qty: 30 | Fill #2

## 2019-06-08 ENCOUNTER — Other Ambulatory Visit: Payer: Self-pay | Admitting: Family Medicine

## 2019-06-08 DIAGNOSIS — I10 Essential (primary) hypertension: Secondary | ICD-10-CM

## 2019-06-08 MED FILL — ?AMLODIPINE BESYLATE 10 MG: 10 | 30 days supply | Qty: 30 | Fill #11

## 2019-06-08 MED FILL — ?CHLORTHALIDONE 25 MG TABLE: 25 | 30 days supply | Qty: 30 | Fill #0

## 2019-07-12 ENCOUNTER — Other Ambulatory Visit: Payer: Self-pay | Admitting: Family Medicine

## 2019-07-12 MED FILL — ?CHLORTHALIDONE 25 MG TABLE: 25 | 30 days supply | Qty: 30 | Fill #1

## 2019-07-12 MED FILL — AMLODIPINE BESYLATE 10 MG T: 10 | 30 days supply | Qty: 30 | Fill #0

## 2019-08-06 ENCOUNTER — Other Ambulatory Visit: Payer: Self-pay

## 2019-08-06 ENCOUNTER — Encounter: Payer: Self-pay | Admitting: Nurse Practitioner

## 2019-08-06 ENCOUNTER — Ambulatory Visit: Payer: Self-pay | Attending: Nurse Practitioner | Admitting: Nurse Practitioner

## 2019-08-06 VITALS — BP 123/83 | HR 84 | Temp 98.1°F | Resp 17 | Wt 199.0 lb

## 2019-08-06 DIAGNOSIS — E559 Vitamin D deficiency, unspecified: Secondary | ICD-10-CM

## 2019-08-06 DIAGNOSIS — R7989 Other specified abnormal findings of blood chemistry: Secondary | ICD-10-CM

## 2019-08-06 DIAGNOSIS — D500 Iron deficiency anemia secondary to blood loss (chronic): Secondary | ICD-10-CM

## 2019-08-06 DIAGNOSIS — I10 Essential (primary) hypertension: Secondary | ICD-10-CM

## 2019-08-06 DIAGNOSIS — R7303 Prediabetes: Secondary | ICD-10-CM

## 2019-08-06 DIAGNOSIS — Z1211 Encounter for screening for malignant neoplasm of colon: Secondary | ICD-10-CM

## 2019-08-06 MED ORDER — AMLODIPINE BESYLATE 10 MG PO TABS: 10.0000 mg | ORAL_TABLET | Freq: Every day | ORAL | 2 refills | Status: DC

## 2019-08-06 MED ORDER — CHLORTHALIDONE 25 MG PO TABS: 25.0000 mg | ORAL_TABLET | Freq: Every day | ORAL | 2 refills | Status: AC

## 2019-08-06 MED FILL — CHLORTHALIDONE 25 MG TAB: 25 | 90 days supply | Qty: 90 | Fill #0

## 2019-08-06 MED FILL — AMLODIPINE BESYLATE 10 MG T: 10 | 90 days supply | Qty: 90 | Fill #0

## 2019-08-06 NOTE — Progress Notes (Signed)
Here for BP follow up. Doesn't check BP at home. Denies chest pain, SHOB, headaches, lower extremity swelling, palpitations, dizziness.  Declines flu shot.

## 2019-08-06 NOTE — Patient Instructions (Signed)
c 

## 2019-08-06 NOTE — Progress Notes (Signed)
Assessment & Plan:  Abigail Gonzalez was seen today for hypertension.  Diagnoses and all orders for this visit:  Essential hypertension -     amLODipine (NORVASC) 10 MG tablet; Take 1 tablet (10 mg total) by mouth daily. Please fill as a 90 day supply -     chlorthalidone (HYGROTON) 25 MG tablet; Take 1 tablet (25 mg total) by mouth daily. Please fill as a 90 day supply -     CMP14+EGFR Continue all antihypertensives as prescribed.  Remember to bring in your blood pressure log with you for your follow up appointment.  DASH/Mediterranean Diets are healthier choices for HTN.    Colon cancer screening -     Fecal occult blood, imunochemical([facility]/Sun[facility]); Future  Abnormal TSH -     Thyroid Panel With TSH  Iron deficiency anemia due to chronic blood loss -     CBC  Vitamin D deficiency disease -     Vitamin D level  Prediabetes -     Lipid Panel -     A1c    Patient has been counseled on age-appropriate routine health concerns for screening and prevention. These are reviewed and up-to-date. Referrals have been placed accordingly. Immunizations are up-to-date or declined.    Subjective:   Chief Complaint  Patient presents with  . Hypertension   HPI Abigail Gonzalez 53 y.o. female presents to office today for follow-up.  has a past medical history of Anemia, Dysfunctional uterine bleeding, and Hypertension.   ESSENTIAL HYPERTENSION Well-controlled today.  She endorses medication compliance taking amlodipine 10 mg daily and chlorthalidone 25 mg daily.  She does not monitor her blood pressure at home as she does not have a monitoring device. Denies chest pain, shortness of breath, palpitations, lightheadedness, dizziness, headaches or BLE edema.  She does not exercise.  Weight is stable. Does not add salt to her food.   BP Readings from Last 3 Encounters:  08/06/19 123/83  09/07/18 (!) 153/89  08/01/18 137/86    Anemia She has a history of anemia with dysfunctional  uterine bleeding.  Was referred to gynecology last year and options at that time for treatment included D&C however patient declined.  Due to her poorly controlled hypertension oral contraceptive pills were not an option.  IUD was also suggested however patient stated she want to speak with her husband to discuss her options and she will call back to schedule an appointment when she had made a decision.   Review of Systems  Constitutional: Negative for fever, malaise/fatigue and weight loss.  HENT: Negative.  Negative for nosebleeds.   Eyes: Negative.  Negative for blurred vision, double vision and photophobia.  Respiratory: Negative.  Negative for cough and shortness of breath.   Cardiovascular: Negative.  Negative for chest pain, palpitations and leg swelling.  Gastrointestinal: Negative.  Negative for heartburn, nausea and vomiting.  Musculoskeletal: Negative.  Negative for myalgias.  Neurological: Negative.  Negative for dizziness, focal weakness, seizures and headaches.  Psychiatric/Behavioral: Negative.  Negative for suicidal ideas.    Past Medical History:  Diagnosis Date  . Anemia   . Dysfunctional uterine bleeding   . Hypertension     Past Surgical History:  Procedure Laterality Date  . NO PAST SURGERIES      Family History  Problem Relation Age of Onset  . Diabetes Mother   . Hypertension Father     Social History Reviewed with no changes to be made today.   Outpatient Medications Prior to Visit  Medication  Sig Dispense Refill  . amLODipine (NORVASC) 10 MG tablet TAKE 1 TABLET BY MOUTH DAILY. 30 tablet 0  . chlorthalidone (HYGROTON) 25 MG tablet TAKE 1 TABLET BY MOUTH DAILY. 30 tablet 2  . acetaminophen (TYLENOL) 500 MG tablet Take 500 mg by mouth every 6 (six) hours as needed for mild pain.     . ferrous sulfate 325 (65 FE) MG EC tablet Take 1 tablet (325 mg total) by mouth 3 (three) times daily with meals. 90 tablet 3  . megestrol (MEGACE) 40 MG tablet Take 1  tablet (40 mg total) by mouth 2 (two) times daily. (Patient not taking: Reported on 09/07/2018) 60 tablet 5   No facility-administered medications prior to visit.     No Known Allergies     Objective:    BP 123/83   Pulse 84   Temp 98.1 F (36.7 C) (Temporal)   Resp 17   Wt 199 lb (90.3 kg)   LMP 07/24/2019 (Approximate)   SpO2 100%   BMI 35.25 kg/m  Wt Readings from Last 3 Encounters:  08/06/19 199 lb (90.3 kg)  09/07/18 196 lb (88.9 kg)  08/01/18 193 lb 5.5 oz (87.7 kg)    Physical Exam Vitals signs and nursing note reviewed.  Constitutional:      Appearance: She is well-developed.  HENT:     Head: Normocephalic and atraumatic.  Neck:     Musculoskeletal: Normal range of motion.  Cardiovascular:     Rate and Rhythm: Normal rate and regular rhythm.     Heart sounds: Normal heart sounds. No murmur. No friction rub. No gallop.   Pulmonary:     Effort: Pulmonary effort is normal. No tachypnea or respiratory distress.     Breath sounds: Normal breath sounds. No decreased breath sounds, wheezing, rhonchi or rales.  Chest:     Chest wall: No tenderness.  Abdominal:     General: Bowel sounds are normal.     Palpations: Abdomen is soft.  Musculoskeletal: Normal range of motion.  Skin:    General: Skin is warm and dry.  Neurological:     Mental Status: She is alert and oriented to person, place, and time.     Coordination: Coordination normal.  Psychiatric:        Behavior: Behavior normal. Behavior is cooperative.        Thought Content: Thought content normal.        Judgment: Judgment normal.          Patient has been counseled extensively about nutrition and exercise as well as the importance of adherence with medications and regular follow-up. The patient was given clear instructions to go to ER or return to medical center if symptoms don't improve, worsen or new problems develop. The patient verbalized understanding.   Follow-up: Return in about 3 months  (around 11/06/2019) for PHYSICAL.   Gildardo Pounds, FNP-BC [facility] and [place] [facility], [place]   08/06/2019, 2:53 PM

## 2019-08-07 ENCOUNTER — Other Ambulatory Visit: Payer: Self-pay | Admitting: Nurse Practitioner

## 2019-08-07 DIAGNOSIS — N938 Other specified abnormal uterine and vaginal bleeding: Secondary | ICD-10-CM

## 2019-08-07 LAB — CMP14+EGFR
ALT: 28 IU/L (ref 0–32)
AST: 20 IU/L (ref 0–40)
Albumin/Globulin Ratio: 1.2 (ref 1.2–2.2)
Albumin: 4 g/dL (ref 3.8–4.9)
Alkaline Phosphatase: 71 IU/L (ref 39–117)
BUN/Creatinine Ratio: 16 (ref 9–23)
BUN: 10 mg/dL (ref 6–24)
Bilirubin Total: 0.3 mg/dL (ref 0.0–1.2)
CO2: 28 mmol/L (ref 20–29)
Calcium: 9.4 mg/dL (ref 8.7–10.2)
Chloride: 100 mmol/L (ref 96–106)
Creatinine, Ser: 0.62 mg/dL (ref 0.57–1.00)
GFR calc Af Amer: 120 mL/min/{1.73_m2} (ref >59)
GFR calc non Af Amer: 104 mL/min/{1.73_m2} (ref >59)
Globulin, Total: 3.4 g/dL (ref 1.5–4.5)
Glucose: 138 mg/dL — ABNORMAL HIGH (ref 65–99)
Potassium: 3.5 mmol/L (ref 3.5–5.2)
Sodium: 142 mmol/L (ref 134–144)
Total Protein: 7.4 g/dL (ref 6.0–8.5)

## 2019-08-07 LAB — CBC
Hematocrit: 31.9 % — ABNORMAL LOW (ref 34.0–46.6)
Hemoglobin: 10.3 g/dL — ABNORMAL LOW (ref 11.1–15.9)
MCH: 28.4 pg (ref 26.6–33.0)
MCHC: 32.3 g/dL (ref 31.5–35.7)
MCV: 88 fL (ref 79–97)
Platelets: 418 10*3/uL (ref 150–450)
RBC: 3.63 x10E6/uL — ABNORMAL LOW (ref 3.77–5.28)
RDW: 13.1 % (ref 11.7–15.4)
WBC: 9.5 10*3/uL (ref 3.4–10.8)

## 2019-08-07 LAB — LIPID PANEL
Chol/HDL Ratio: 3.2 ratio (ref 0.0–4.4)
Cholesterol, Total: 155 mg/dL (ref 100–199)
HDL: 48 mg/dL (ref >39)
LDL Chol Calc (NIH): 89 mg/dL (ref 0–99)
Triglycerides: 97 mg/dL (ref 0–149)
VLDL Cholesterol Cal: 18 mg/dL (ref 5–40)

## 2019-08-07 LAB — VITAMIN D 25 HYDROXY (VIT D DEFICIENCY, FRACTURES): Vit D, 25-Hydroxy: 19.4 ng/mL — ABNORMAL LOW (ref 30.0–100.0)

## 2019-08-07 LAB — THYROID PANEL WITH TSH
Free Thyroxine Index: 2.4 (ref 1.2–4.9)
T3 Uptake Ratio: 28 % (ref 24–39)
T4, Total: 8.7 ug/dL (ref 4.5–12.0)
TSH: 8.2 u[IU]/mL — ABNORMAL HIGH (ref 0.450–4.500)

## 2019-08-07 LAB — HEMOGLOBIN A1C
Est. average glucose Bld gHb Est-mCnc: 105 mg/dL
Hgb A1c MFr Bld: 5.3 % (ref 4.8–5.6)

## 2019-08-07 MED ORDER — LEVOTHYROXINE SODIUM 88 MCG PO TABS: 88.0000 ug | ORAL_TABLET | Freq: Every day | ORAL | 0 refills | Status: DC

## 2019-08-07 MED ORDER — FERROUS SULFATE 325 (65 FE) MG PO TBEC: 325.0000 mg | DELAYED_RELEASE_TABLET | Freq: Every day | ORAL | 2 refills | Status: DC

## 2019-08-07 MED ORDER — VITAMIN D (ERGOCALCIFEROL) 1.25 MG (50000 UNIT) PO CAPS: 50000.0000 [IU] | ORAL_CAPSULE | ORAL | 1 refills | Status: AC

## 2019-08-09 MED FILL — FERROUS SULFATE 325 MG TAB: 325 (65 FE) | 30 days supply | Qty: 30 | Fill #0

## 2019-08-09 MED FILL — LEVOTHYROXINE 88 MCG TABLET: 88 | 30 days supply | Qty: 30 | Fill #0

## 2019-08-11 ENCOUNTER — Telehealth: Payer: Self-pay | Admitting: Nurse Practitioner

## 2019-08-11 NOTE — Telephone Encounter (Signed)
Patient called asking for a different pil

## 2019-08-12 NOTE — Telephone Encounter (Signed)
Patient cannot use gelatin which is in the 50,000 unit ergocalciferol capsules. Will route to PCP to see if she wants to use a different therapy.

## 2019-08-15 NOTE — Telephone Encounter (Signed)
She can take 5000 units of vitamin D daily over the counter

## 2019-08-17 NOTE — Telephone Encounter (Signed)
Pt. Was inform to take OTC by the pharmacy.

## 2019-09-07 MED FILL — FERROUS SULFATE 325 MG TAB: 325 (65 FE) | 30 days supply | Qty: 30 | Fill #1

## 2019-09-20 ENCOUNTER — Ambulatory Visit: Payer: Medicaid Other | Admitting: Obstetrics and Gynecology

## 2019-10-08 ENCOUNTER — Other Ambulatory Visit: Payer: Self-pay

## 2019-10-08 ENCOUNTER — Ambulatory Visit (INDEPENDENT_AMBULATORY_CARE_PROVIDER_SITE_OTHER): Payer: Self-pay | Admitting: Obstetrics and Gynecology

## 2019-10-08 ENCOUNTER — Encounter: Payer: Self-pay | Admitting: Obstetrics and Gynecology

## 2019-10-08 VITALS — BP 157/84 | HR 111 | Wt 195.0 lb

## 2019-10-08 DIAGNOSIS — D649 Anemia, unspecified: Secondary | ICD-10-CM

## 2019-10-08 DIAGNOSIS — N939 Abnormal uterine and vaginal bleeding, unspecified: Secondary | ICD-10-CM

## 2019-10-08 MED FILL — LEVOTHYROXINE 88 MCG TABLET: 88 | 30 days supply | Qty: 30 | Fill #1

## 2019-10-08 MED FILL — FERROUS SULFATE 325 MG TAB: 325 (65 FE) | 30 days supply | Qty: 30 | Fill #2

## 2019-10-08 NOTE — Progress Notes (Signed)
Obstetrics and Gynecology Established Patient Evaluation  Appointment Date: 10/08/2019  OBGYN Clinic: Center for [facility]  Primary Care Provider: Gildardo Pounds  Referring Provider: Gildardo Pounds, NP  Chief Complaint: Follow up AUB  History of Present Illness: Abigail Gonzalez is a 54 y.o.  P3 (LMP: 12/4), seen for the above chief complaint. Her past medical history is significant for HTN.  Patient is a Dr. Hulan Fray patient and has been followed by her for this and back to 2016 by other GYN providers.  Her workup includes a negative 06/2018 embx, negative pap in 04/2018. 05/2018 u/s showed focal endometrial thickening and some vascularity (see below).   She was previously offered d&c or LNG IUD and she declined this.    No denies any VB, pain, or overt climateric s/s. She has ?night sweats and rare hot flashes but no vaginal dryness or discharge, dysuria  Review of Systems: Pertinent items noted in HPI and remainder of comprehensive ROS otherwise negative.   Past Medical History:  Past Medical History:  Diagnosis Date  . Anemia   . Dysfunctional uterine bleeding   . Hypertension   . Iron deficiency anemia due to chronic blood loss 02/01/2016  . Sprain of left shoulder 12/21/2012  . Vitamin D insufficiency 12/22/2012    Past Surgical History:  Past Surgical History:  Procedure Laterality Date  . NO PAST SURGERIES      Past Obstetrical History:  OB History  Gravida Para Term Preterm AB Living  4       1 3   SAB TAB Ectopic Multiple Live Births  1       3    # Outcome Date GA Lbr Len/2nd Weight Sex Delivery Anes PTL Lv  4 SAB           3 Gravida      Vag-Spont     2 Grav[place]      Vag-Spont     1 [place]      Vag-Spont      Past Gynecological History: As per HPI. Periods: qmonth and sometimes can be every other month. 8-9 days, starts light, then heavy 3-4 days and then light.  No painful. No intermenstrual bleeding History of Pap Smear(s): Yes.   Last pap  2019, which was negative She is currently using no method for contraception.   Social History:  Social History   Socioeconomic History  . Marital status: Married    Spouse name: Not on file  . Number of children: Not on file  . Years of education: Not on file  . Highest education level: Not on file  Occupational History  . Not on file  Tobacco Use  . Smoking status: Never Smoker  . Smokeless tobacco: Never Used  Substance and Sexual Activity  . Alcohol use: No  . Drug use: No  . Sexual activity: Yes    Birth control/protection: None  Other Topics Concern  . Not on file  Social History Narrative  . Not on file   Social Determinants of Health   Financial Resource Strain:   . Difficulty of Paying Living Expenses: Not on file  Food Insecurity:   . Worried About Charity fundraiser in the Last Year: Not on file  . Ran Out of Food in the Last Year: Not on file  Transportation Needs:   . Lack of Transportation (Medical): Not on file  . Lack of Transportation (Non-Medical): Not on file  Physical Activity:   . Days  of Exercise per Week: Not on file  . Minutes of Exercise per Session: Not on file  Stress:   . Feeling of Stress : Not on file  Social Connections:   . Frequency of Communication with Friends and Family: Not on file  . Frequency of Social Gatherings with Friends and Family: Not on file  . Attends Religious Services: Not on file  . Active Member of Clubs or Organizations: Not on file  . Attends Archivist Meetings: Not on file  . Marital Status: Not on file  Intimate Partner Violence:   . Fear of Current or Ex-Partner: Not on file  . Emotionally Abused: Not on file  . Physically Abused: Not on file  . Sexually Abused: Not on file    Family History:  Family History  Problem Relation Age of Onset  . Diabetes Mother   . Hypertension Father     Health Maintenance:  Mammogram(s): Yes.   Date: 04/2018  Medications Ita M. Hinde had no  medications administered during this visit. Current Outpatient Medications  Medication Sig Dispense Refill  . amLODipine (NORVASC) 10 MG tablet Take 1 tablet (10 mg total) by mouth daily. Please fill as a 90 day supply 30 tablet 2  . chlorthalidone (HYGROTON) 25 MG tablet Take 1 tablet (25 mg total) by mouth daily. Please fill as a 90 day supply 90 tablet 2  . ferrous sulfate 325 (65 FE) MG EC tablet Take 1 tablet (325 mg total) by mouth daily with breakfast. 90 tablet 2  . levothyroxine (SYNTHROID) 88 MCG tablet Take 1 tablet (88 mcg total) by mouth daily. 90 tablet 0  . Vitamin D, Ergocalciferol, (DRISDOL) 1.25 MG (50000 UT) CAPS capsule Take 1 capsule (50,000 Units total) by mouth every 7 (seven) days. Please fill as a 90 day supply 12 capsule 1   No current facility-administered medications for this visit.    Allergies Patient has no known allergies.   Physical Exam:  BP (!) 157/84   Pulse (!) 111   Wt 195 lb (88.5 kg)   BMI 34.54 kg/m  Body mass index is 34.54 kg/m. General appearance: Well nourished, well developed female in no acute distress.  Neck:  Supple, normal appearance, and no thyromegaly  Cardiovascular: normal s1 and s2.  No murmurs, rubs or gallops. Respiratory:  Clear to auscultation bilateral. Normal respiratory effort Abdomen: positive bowel sounds and no masses, hernias; diffusely non tender to palpation, non distended Neuro/Psych:  Normal mood and affect.  Skin:  Warm and dry.   Pelvic exam from Dr. Hulan Fray 9-06/2018 Patient sounded to [place]  Laboratory:  08/2019: elevated TsH but normal ft4 and t3.  CBC Latest Ref Rng & Units 08/06/2019 05/11/2018 04/07/2018  WBC 3.4 - 10.8 x10E3/uL 9.5 10.0 8.8  Hemoglobin 11.1 - 15.9 g/dL 10.3(L) 10.6(L) 9.3(L)  Hematocrit 34.0 - 46.6 % 31.9(L) 32.2(L) 32.8(L)  Platelets 150 - 450 x10E3/uL 418 356 327   Radiology: CLINICAL DATA:  Dysfunctional uterine bleeding  EXAM: ULTRASOUND PELVIS  TRANSVAGINAL  TECHNIQUE: Transvaginal ultrasound examination of the pelvis was performed including evaluation of the uterus, ovaries, adnexal regions, and pelvic cul-de-sac.  COMPARISON:  None.  FINDINGS: Uterus  Measurements: 11.5 x 7.3 x 8.8 cm. 1.6 x 0.8 x 1.6 cm intramural fibroid in the posterior uterine body.  Endometrium  Thickness: 13 mm. Focal thickening in the uterine fundus with associated mild vascularity.  Right ovary  Not discretely visualized.  No adnexal mass is seen.  Left ovary  Not discretely visualized. No adnexal mass is seen.  Other findings:  No abnormal free fluid  IMPRESSION: Focal endometrial thickening in the uterine fundus, measuring 13 mm, with mild vascularity. This appearance raises concern for endometrial polyp. Consider hysteroscopy and/or endometrial sampling, as clinically warranted.  1.6 cm intramural fibroid in the posterior uterine body.  Bilateral ovaries are not discretely visualized. No adnexal mass is seen.   Electronically Signed   By: Julian Hy M.D.   On: 06/10/2018 10:42  Assessment: pt stable  Plan:  1. Abnormal uterine bleeding (AUB) D/w her that I'd like to get an [facility] level to give a better indication if she is having perimenopausal bleeding or post menopausal bleeding which would help determine which way to treat.  - Follicle stimulating hormone - Estradiol - BHCG, Quant*LC - CBC  2. Anemia, unspecified type Pt on qday iron  - CBC  RTC f/u with patient via phone next weeks  Durene Romans MD Attending Center for [facility])

## 2019-10-08 NOTE — Progress Notes (Signed)
Sometimes every month sometimes skip a month  Last one was 12/4 and lasted 2 weeks and its time for it to come, hasn't been regular.  Heavy bleeding has been happening a long time but the irregular started last year.

## 2019-10-09 LAB — CBC
Hematocrit: 41.6 % (ref 34.0–46.6)
Hemoglobin: 13.4 g/dL (ref 11.1–15.9)
MCH: 26.9 pg (ref 26.6–33.0)
MCHC: 32.2 g/dL (ref 31.5–35.7)
MCV: 84 fL (ref 79–97)
Platelets: 311 10*3/uL (ref 150–450)
RBC: 4.98 x10E6/uL (ref 3.77–5.28)
RDW: 13.6 % (ref 11.7–15.4)
WBC: 5 10*3/uL (ref 3.4–10.8)

## 2019-10-09 LAB — ESTRADIOL: Estradiol: 11 pg/mL

## 2019-10-09 LAB — FOLLICLE STIMULATING HORMONE: FSH: 37 m[IU]/mL

## 2019-10-09 LAB — BETA HCG QUANT (REF LAB): hCG Quant: 1 m[IU]/mL

## 2019-10-12 ENCOUNTER — Telehealth: Payer: Self-pay | Admitting: Obstetrics and Gynecology

## 2019-10-12 NOTE — Telephone Encounter (Signed)
GYN Telephone Note Patient called at [phone] to go over lab results. VM left asking her to call the clinic. Labs seem to indicate that she is menopausal and thus having postmenpausal bleeding and a hysteroscopy, d&c is indicated. If no response from patient, will have clinic reach out to her to schedule f/u visit with me  Durene Romans MD Attending Center for [place] (Faculty Practice) 10/12/2019 Time: 1048am

## 2019-10-20 IMAGING — MG DIGITAL SCREENING BILATERAL MAMMOGRAM WITH TOMO AND CAD
8 series · 8 of 24 positions shown · non-contrast
Comparison: Previous exam(s).

CLINICAL DATA: Screening.

EXAM:
DIGITAL SCREENING BILATERAL MAMMOGRAM WITH TOMO AND CAD

[R MLO synth-2D]
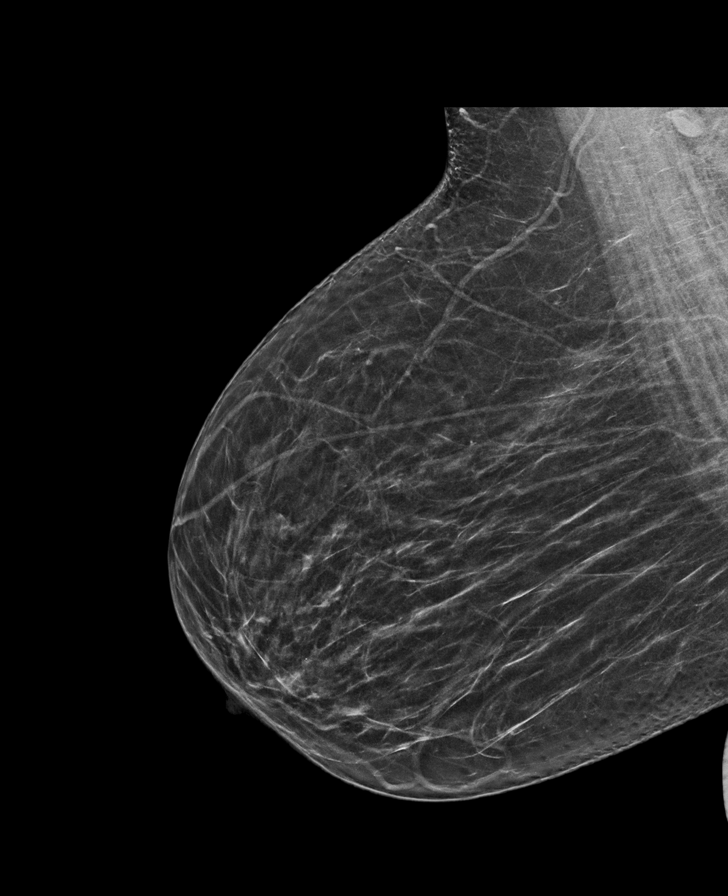

[L CC synth-2D]
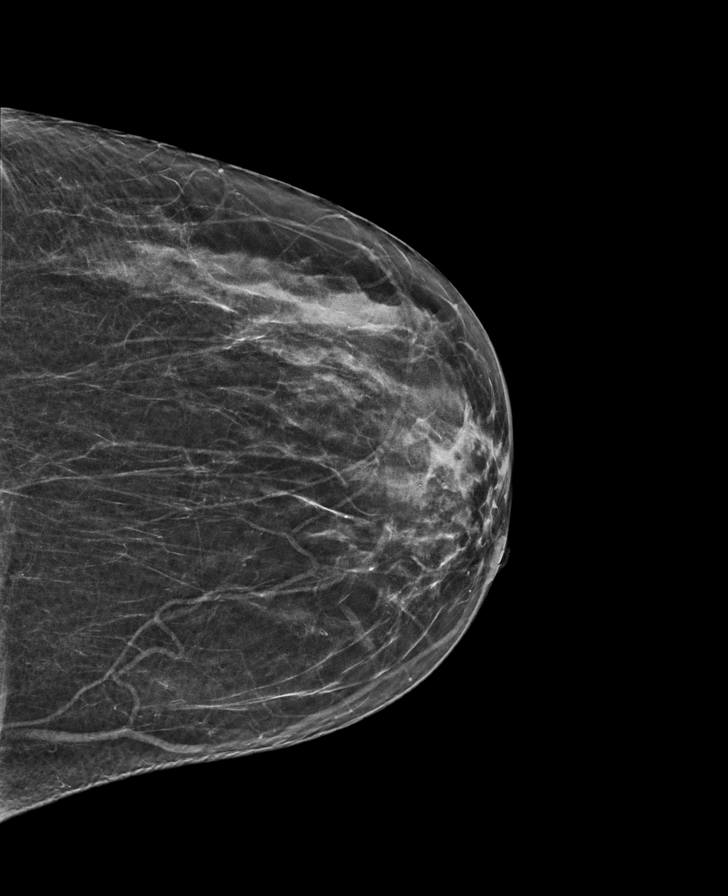

[R CC synth-2D]
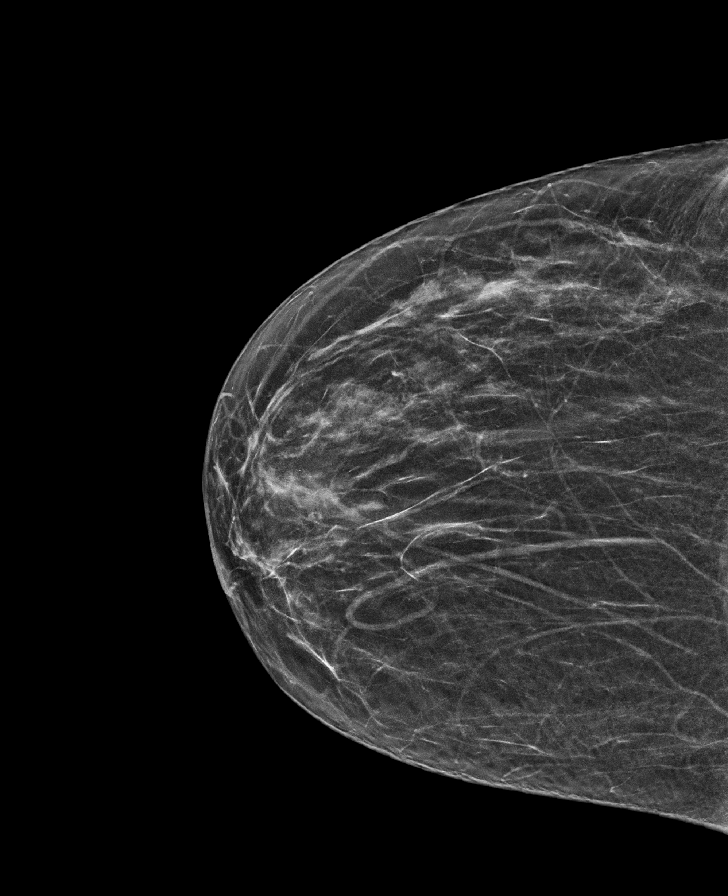

[L MLO synth-2D]
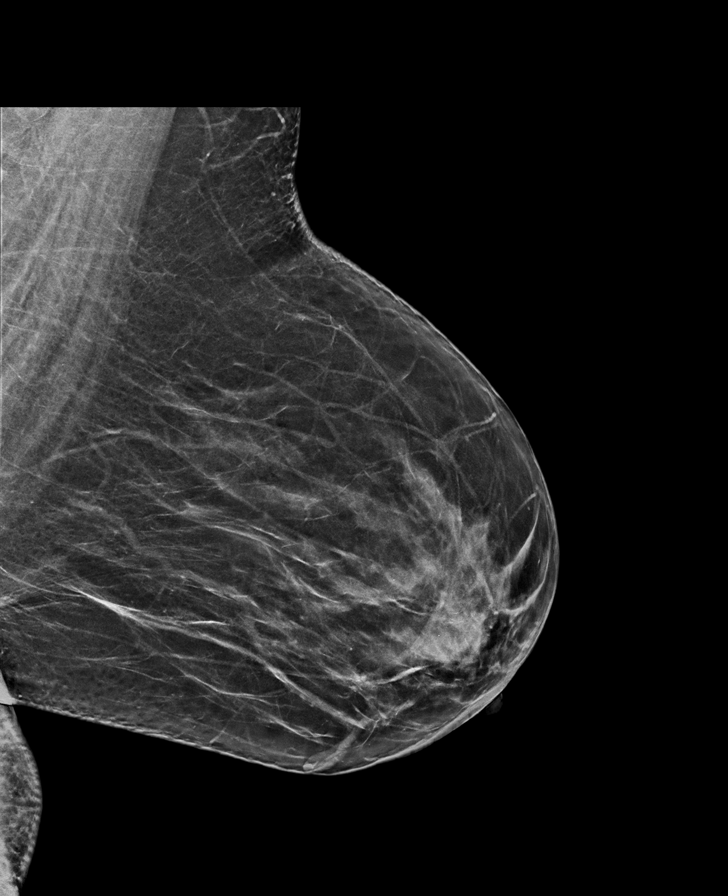

[R MLO tomo · tomo slice 32/63.0]
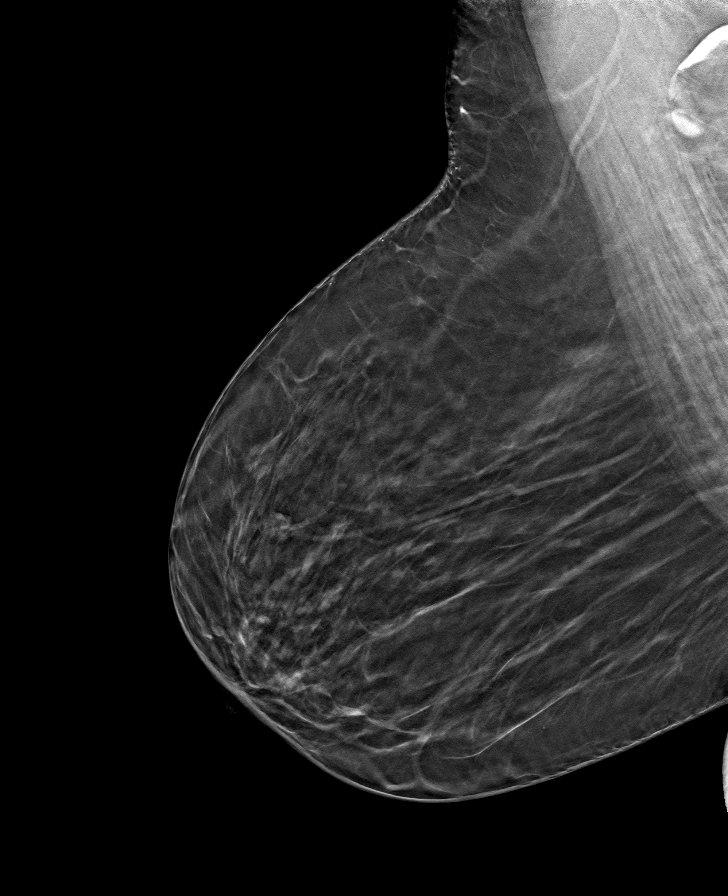

[L MLO tomo · tomo slice 35/70.0]
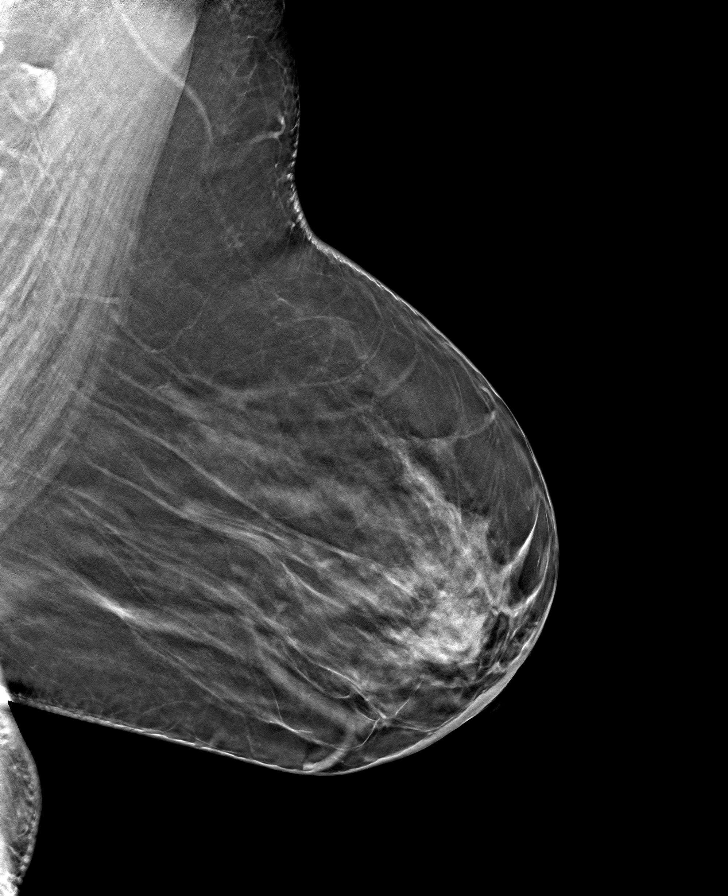

[R CC tomo · tomo slice 31/60.0]
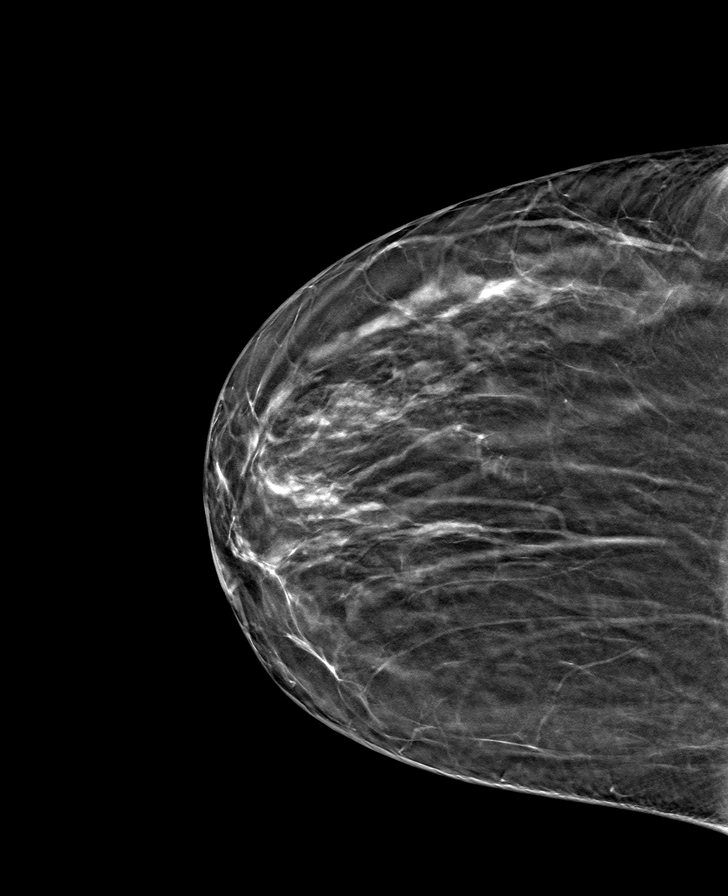

[L CC tomo · tomo slice 30/59.0]
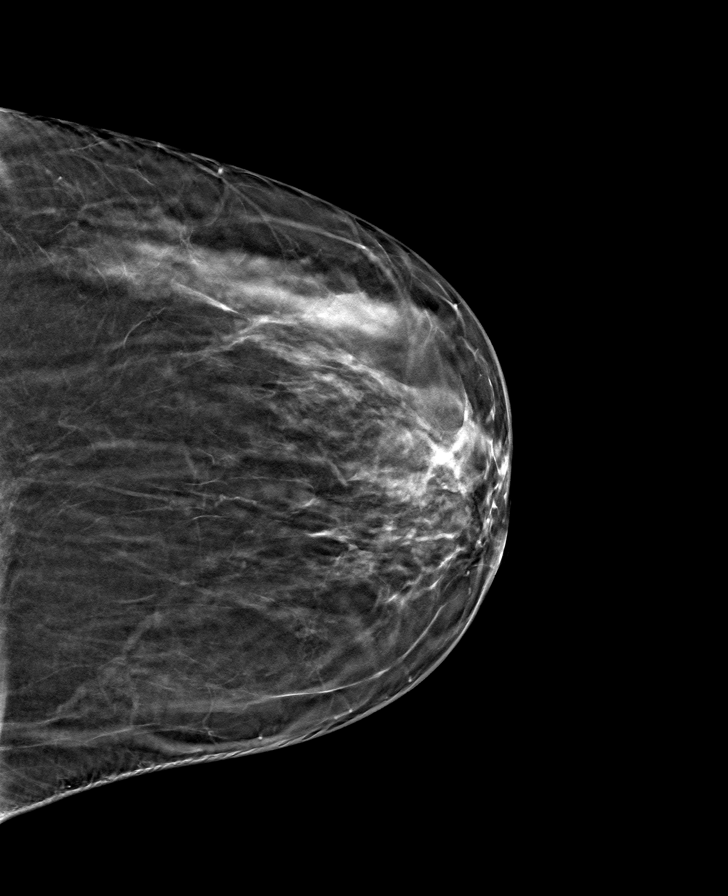

[8 of 24 positions shown; findings below may reference images not displayed]

ACR Breast Density Category b: There are scattered areas of
fibroglandular density.
FINDINGS: There are no findings suspicious for malignancy. Images were
processed with CAD.
IMPRESSION: No mammographic evidence of malignancy. A result letter of this
screening mammogram will be mailed directly to the patient.

RECOMMENDATION:
Screening mammogram in one year. (Code:[id number]-775)

BI-RADS CATEGORY  1: Negative.

## 2019-11-08 ENCOUNTER — Other Ambulatory Visit: Payer: Self-pay

## 2019-11-08 ENCOUNTER — Other Ambulatory Visit: Payer: Self-pay | Admitting: Nurse Practitioner

## 2019-11-08 ENCOUNTER — Ambulatory Visit: Payer: Medicaid Other | Attending: Nurse Practitioner | Admitting: Nurse Practitioner

## 2019-11-08 ENCOUNTER — Encounter: Payer: Self-pay | Admitting: Nurse Practitioner

## 2019-11-08 VITALS — BP 155/93 | HR 104 | Temp 97.8°F | Ht 63.6 in | Wt 195.0 lb

## 2019-11-08 DIAGNOSIS — Z1231 Encounter for screening mammogram for malignant neoplasm of breast: Secondary | ICD-10-CM

## 2019-11-08 DIAGNOSIS — I10 Essential (primary) hypertension: Secondary | ICD-10-CM | POA: Diagnosis not present

## 2019-11-08 DIAGNOSIS — Z1211 Encounter for screening for malignant neoplasm of colon: Secondary | ICD-10-CM

## 2019-11-08 DIAGNOSIS — Z0000 Encounter for general adult medical examination without abnormal findings: Secondary | ICD-10-CM

## 2019-11-08 DIAGNOSIS — Z0001 Encounter for general adult medical examination with abnormal findings: Secondary | ICD-10-CM

## 2019-11-08 MED FILL — AMLODIPINE BESYLATE 10 MG T: 10 | 90 days supply | Qty: 90 | Fill #0

## 2019-11-08 MED FILL — CHLORTHALIDONE 25 MG TAB: 25 | 30 days supply | Qty: 30 | Fill #1

## 2019-11-08 MED FILL — LEVOTHYROXINE 88 MCG TABLET: 88 | 30 days supply | Qty: 30 | Fill #2

## 2019-11-08 NOTE — Progress Notes (Signed)
Assessment & Plan:  [name] was seen today for annual exam.  Diagnoses and all orders for this visit:  Annual physical exam  Colon cancer screening -     Fecal occult blood, imunochemical([facility])    Patient has been counseled on age-appropriate routine health concerns for screening and prevention. These are reviewed and up-to-date. Referrals have been placed accordingly. Immunizations are up-to-date or declined.    Subjective:   Chief Complaint  Patient presents with  . Annual Exam    Pt. is here for a physical.    HPI Abigail Gonzalez 54 y.o. female presents to office today for annual physical.  She has scheduled hysteroscopy with possible endometrial sampling due to post menopausal AUB  HEALTH MAINTENANCE PAP smear: due 2022 Mammogram: overdue. Referred today  Essential Hypertension Blood pressure is elevated. She endorses medication compliance however amlodipine 10 mg prescription appears to be expired. It does appear her chlorthalidone 25 mg daily is being taken. Denies chest pain, shortness of breath, palpitations, lightheadedness, dizziness, headaches or BLE edema.  BP Readings from Last 3 Encounters:  11/08/19 (!) 155/93  10/08/19 (!) 157/84  08/06/19 123/83   Review of Systems  Constitutional: Negative.  Negative for chills, fever, malaise/fatigue and weight loss.  Respiratory: Negative.  Negative for cough, sputum production, shortness of breath and wheezing.   Cardiovascular: Negative.  Negative for chest pain and leg swelling.  Gastrointestinal: Negative.  Negative for abdominal pain, blood in stool, constipation, diarrhea, heartburn, melena, nausea and vomiting.  Genitourinary: Negative.   Skin: Negative.  Negative for rash.  Neurological: Negative.  Negative for dizziness, tremors, speech change, focal weakness, seizures and headaches.  Psychiatric/Behavioral: Negative.  Negative for depression and suicidal ideas. The patient is not  nervous/anxious and does not have insomnia.     Past Medical History:  Diagnosis Date  . Anemia   . Dysfunctional uterine bleeding   . Hypertension   . Iron deficiency anemia due to chronic blood loss 02/01/2016  . Sprain of left shoulder 12/21/2012  . Vitamin D insufficiency 12/22/2012    Past Surgical History:  Procedure Laterality Date  . NO PAST SURGERIES      Family History  Problem Relation Age of Onset  . Diabetes Mother   . Hypertension Father     Social History Reviewed with no changes to be made today.   Outpatient Medications Prior to Visit  Medication Sig Dispense Refill  . chlorthalidone (HYGROTON) 25 MG tablet Take 1 tablet (25 mg total) by mouth daily. Please fill as a 90 day supply 90 tablet 2  . levothyroxine (SYNTHROID) 88 MCG tablet Take 1 tablet (88 mcg total) by mouth daily. 90 tablet 0  . Vitamin D, Ergocalciferol, (DRISDOL) 1.25 MG (50000 UT) CAPS capsule Take 1 capsule (50,000 Units total) by mouth every 7 (seven) days. Please fill as a 90 day supply 12 capsule 1  . amLODipine (NORVASC) 10 MG tablet Take 1 tablet (10 mg total) by mouth daily. Please fill as a 90 day supply 30 tablet 2  . ferrous sulfate 325 (65 FE) MG EC tablet Take 1 tablet (325 mg total) by mouth daily with breakfast. 90 tablet 2   No facility-administered medications prior to visit.    No Known Allergies     Objective:    BP (!) 155/93 (BP Location: Left Arm, Patient Position: Sitting, Cuff Size: Normal)   Pulse (!) 104   Temp 97.8 F (36.6 C) (Temporal)   Ht 5' 3.6" (1.615 m)  Wt 195 lb (88.5 kg)   LMP 11/04/2019   SpO2 100%   BMI 33.89 kg/m  Wt Readings from Last 3 Encounters:  11/08/19 195 lb (88.5 kg)  10/08/19 195 lb (88.5 kg)  08/06/19 199 lb (90.3 kg)    Physical Exam Constitutional:      Appearance: She is well-developed.  HENT:     Head: Normocephalic and atraumatic.     Right Ear: External ear normal.     Left Ear: External ear normal.     Nose: Nose  normal.     Mouth/Throat:     Pharynx: No oropharyngeal exudate.  Eyes:     General: No scleral icterus.       Right eye: No discharge.     Conjunctiva/sclera: Conjunctivae normal.     Pupils: Pupils are equal, Gonzalez, and reactive to light.  Neck:     Thyroid: No thyromegaly.     Trachea: No tracheal deviation.  Cardiovascular:     Rate and Rhythm: Normal rate and regular rhythm.     Heart sounds: Normal heart sounds. No murmur. No friction rub.  Pulmonary:     Effort: Pulmonary effort is normal. No accessory muscle usage or respiratory distress.     Breath sounds: Normal breath sounds. No decreased breath sounds, wheezing, rhonchi or rales.  Chest:     Chest wall: No tenderness.     Breasts: Breasts are symmetrical.        Right: No inverted nipple, mass, nipple discharge, skin change or tenderness.        Left: No inverted nipple, mass, nipple discharge, skin change or tenderness.  Abdominal:     General: Bowel sounds are normal. There is no distension.     Palpations: Abdomen is soft. There is no mass.     Tenderness: There is no abdominal tenderness. There is no guarding or rebound.  Musculoskeletal:        General: No tenderness or deformity. Normal range of motion.     Cervical back: Normal range of motion and neck supple.  Lymphadenopathy:     Cervical: No cervical adenopathy.  Skin:    General: Skin is warm and dry.     Findings: No erythema.  Neurological:     Mental Status: She is alert and oriented to person, place, and time.     Cranial Nerves: No cranial nerve deficit.     Coordination: Coordination normal.     Deep Tendon Reflexes: Reflexes are normal and symmetric.  Psychiatric:        Speech: Speech normal.        Behavior: Behavior normal.        Thought Content: Thought content normal.        Judgment: Judgment normal.          Patient has been counseled extensively about nutrition and exercise as well as the importance of adherence with medications  and regular follow-up. The patient was given clear instructions to go to ER or return to medical center if symptoms don't improve, worsen or new problems develop. The patient verbalized understanding.   Follow-up: Return in about 6 weeks (around 12/20/2019) for TSH and cbc. She has not been taking her synthroid. WIll continue current dose and have her return in 6 weeks for TSH.   Gildardo Pounds, FNP-BC [facility] and [place] [place], [place]   11/20/2019, 9:46 PM

## 2019-11-10 ENCOUNTER — Telehealth (INDEPENDENT_AMBULATORY_CARE_PROVIDER_SITE_OTHER): Payer: Self-pay | Admitting: Obstetrics and Gynecology

## 2019-11-10 ENCOUNTER — Encounter: Payer: Self-pay | Admitting: Obstetrics and Gynecology

## 2019-11-10 DIAGNOSIS — N950 Postmenopausal bleeding: Secondary | ICD-10-CM

## 2019-11-10 MED FILL — CHLORTHALIDONE 25 MG TAB: 25 | 60 days supply | Qty: 60 | Fill #2

## 2019-11-10 NOTE — Progress Notes (Signed)
I connected with  Abigail Gonzalez on 11/10/19 at 11:15 AM EST by telephone and verified that I am speaking with the correct person using two identifiers.   I discussed the limitations, risks, security and privacy concerns of performing an evaluation and management service by telephone and the availability of in person appointments. I also discussed with the patient that there may be a patient responsible charge related to this service. The patient expressed understanding and agreed to proceed.  Derinda Late, RN 11/10/2019  11:10 AM

## 2019-11-10 NOTE — Progress Notes (Signed)
TELEHEALTH VIRTUAL GYNECOLOGY VISIT ENCOUNTER NOTE  I connected with Abigail Gonzalez on 11/10/19 at 11:15 AM EST by telephone at home and verified that I am speaking with the correct person using two identifiers.   I discussed the limitations, risks, security and privacy concerns of performing an evaluation and management service by telephone and the availability of in person appointments. I also discussed with the patient that there may be a patient responsible charge related to this service. The patient expressed understanding and agreed to proceed.  Chief Complaint: follow up postmenopausal bleeding  History:  Abigail Gonzalez is a 54 y.o. [phone] being evaluated today for above CC. PMHx significant for HTN.   Interval History:  I saw her 1/8 and lab work up was consistent with being in menopause.   HPI: Patient is a Dr. Hulan Gonzalez patient and has been followed by her for this and back to 2016 by other GYN providers.  Her workup includes a negative 06/2018 embx, negative pap in 04/2018. 05/2018 u/s showed focal endometrial thickening and some vascularity (see below).   She was previously offered d&c or LNG IUD and she declined this.    No denies any VB, pain, or overt climateric s/s. She has ?night sweats and rare hot flashes but no vaginal dryness or discharge, dysuria  Periods: qmonth and sometimes can be every other month. 8-9 days, starts light, then heavy 3-4 days and then light.  No painful. No intermenstrual bleeding History of Pap Smear(s): Yes.   Last pap 2019, which was negative She is currently using no method for contraception.  CLINICAL DATA: Dysfunctional uterine bleeding  EXAM: ULTRASOUND PELVIS TRANSVAGINAL  TECHNIQUE: Transvaginal ultrasound examination of the pelvis was performed including evaluation of the uterus, ovaries, adnexal regions, and pelvic cul-de-sac.  COMPARISON: None.  FINDINGS: Uterus  Measurements: 11.5 x 7.3 x 8.8 cm. 1.6 x 0.8  x 1.6 cm intramural fibroid in the posterior uterine body.  Endometrium  Thickness: 13 mm. Focal thickening in the uterine fundus with associated mild vascularity.  Right ovary  Not discretely visualized. No adnexal mass is seen.  Left ovary  Not discretely visualized. No adnexal mass is seen.  Other findings: No abnormal free fluid  IMPRESSION: Focal endometrial thickening in the uterine fundus, measuring 13 mm, with mild vascularity. This appearance raises concern for endometrial polyp. Consider hysteroscopy and/or endometrial sampling, as clinically warranted.  1.6 cm intramural fibroid in the posterior uterine body.  Bilateral ovaries are not discretely visualized. No adnexal mass is seen.   Electronically Signed By: Julian Hy M.D. On: 06/10/2018 10:42    Past Medical History:  Diagnosis Date  . Anemia   . Dysfunctional uterine bleeding   . Hypertension   . Iron deficiency anemia due to chronic blood loss 02/01/2016  . Sprain of left shoulder 12/21/2012  . Vitamin D insufficiency 12/22/2012   Past Surgical History:  Procedure Laterality Date  . NO PAST SURGERIES     The following portions of the patient's history were reviewed and updated as appropriate: allergies, current medications, past family history, past medical history, past social history, past surgical history and problem list.   Review of Systems:  Pertinent items noted in HPI and remainder of comprehensive ROS otherwise negative.  Physical Exam:   General:  Alert, oriented and cooperative.   Mental Status: Normal mood and affect perceived. Normal judgment and thought content.  Physical exam deferred due to nature of the encounter  Labs and Imaging No results found  for this or any previous visit (from the past 336 hour(s)). No results found.    Assessment and Plan:     1. Postmenopausal bleeding Given persistent persistent PMB, I told her I recommend repeat pap  (negative and hpv neg 2019), hysteroscopy, d&c with goal being for bigger sampling and hopefully stop her bleeding. She wondered about a hyst. I told her if d&c doesn't help with PMB then I'd recommend hyst but i'd given it two months after d&c to see if pmb stops after procedure.        I discussed the assessment and treatment plan with the patient. The patient was provided an opportunity to ask questions and all were answered. The patient agreed with the plan and demonstrated an understanding of the instructions.   The patient was advised to call back or seek an in-person evaluation/go to the ED if the symptoms worsen or if the condition fails to improve as anticipated.  I provided 15 minutes of non-face-to-face time during this encounter. The visit was done via a MyChart visit.    Aletha Halim, MD [facility] [facility]

## 2019-11-17 ENCOUNTER — Other Ambulatory Visit: Payer: Self-pay | Admitting: Obstetrics and Gynecology

## 2019-11-18 ENCOUNTER — Other Ambulatory Visit: Payer: Self-pay

## 2019-11-18 ENCOUNTER — Encounter (HOSPITAL_BASED_OUTPATIENT_CLINIC_OR_DEPARTMENT_OTHER): Payer: Self-pay | Admitting: Obstetrics and Gynecology

## 2019-11-20 ENCOUNTER — Encounter: Payer: Self-pay | Admitting: Nurse Practitioner

## 2019-11-20 ENCOUNTER — Other Ambulatory Visit (HOSPITAL_COMMUNITY)
Admission: RE | Admit: 2019-11-20 | Discharge: 2019-11-20 | Disposition: A | Discharge status: Home / Self Care | Payer: Medicaid Other | Source: Ambulatory Visit | Attending: Obstetrics and Gynecology | Admitting: Obstetrics and Gynecology

## 2019-11-20 DIAGNOSIS — Z20822 Contact with and (suspected) exposure to covid-19: Secondary | ICD-10-CM | POA: Insufficient documentation

## 2019-11-20 DIAGNOSIS — Z01812 Encounter for preprocedural laboratory examination: Secondary | ICD-10-CM | POA: Diagnosis not present

## 2019-11-20 LAB — SARS CORONAVIRUS 2 (TAT 6-24 HRS): SARS Coronavirus 2: NEGATIVE

## 2019-11-20 MED ORDER — LEVOTHYROXINE SODIUM 88 MCG PO TABS: 88.0000 ug | ORAL_TABLET | Freq: Every day | ORAL | 0 refills | Status: DC

## 2019-11-23 ENCOUNTER — Other Ambulatory Visit: Payer: Self-pay

## 2019-11-23 ENCOUNTER — Encounter (HOSPITAL_BASED_OUTPATIENT_CLINIC_OR_DEPARTMENT_OTHER)
Admission: RE | Admit: 2019-11-23 | Discharge: 2019-11-23 | Disposition: A | Discharge status: Home / Self Care | Payer: Medicaid Other | Source: Ambulatory Visit | Attending: Obstetrics and Gynecology | Admitting: Obstetrics and Gynecology

## 2019-11-23 DIAGNOSIS — Z01810 Encounter for preprocedural cardiovascular examination: Secondary | ICD-10-CM | POA: Insufficient documentation

## 2019-11-23 DIAGNOSIS — Z01812 Encounter for preprocedural laboratory examination: Secondary | ICD-10-CM | POA: Insufficient documentation

## 2019-11-23 DIAGNOSIS — R9431 Abnormal electrocardiogram [ECG] [EKG]: Secondary | ICD-10-CM | POA: Insufficient documentation

## 2019-11-23 LAB — BASIC METABOLIC PANEL
Anion gap: 10 (ref 5–15)
BUN: 11 mg/dL (ref 6–20)
CO2: 29 mmol/L (ref 22–32)
Calcium: 9.3 mg/dL (ref 8.9–10.3)
Chloride: 101 mmol/L (ref 98–111)
Creatinine, Ser: 0.78 mg/dL (ref 0.44–1.00)
GFR calc Af Amer: >60 mL/min (ref >60)
GFR calc non Af Amer: >60 mL/min (ref >60)
Glucose, Bld: 114 mg/dL — ABNORMAL HIGH (ref 70–99)
Potassium: 3.2 mmol/L — ABNORMAL LOW (ref 3.5–5.1)
Sodium: 140 mmol/L (ref 135–145)

## 2019-11-23 LAB — POCT PREGNANCY, URINE: Preg Test, Ur: NEGATIVE

## 2019-11-23 NOTE — Progress Notes (Signed)

## 2019-11-23 NOTE — Progress Notes (Signed)
K+ 3.2, will proceed with surgery as scheduled, per Dr. Gifford Shave.

## 2019-11-24 ENCOUNTER — Other Ambulatory Visit: Payer: Self-pay

## 2019-11-24 ENCOUNTER — Ambulatory Visit (HOSPITAL_COMMUNITY): Payer: Self-pay | Admitting: Certified Registered Nurse Anesthetist

## 2019-11-24 ENCOUNTER — Encounter (HOSPITAL_BASED_OUTPATIENT_CLINIC_OR_DEPARTMENT_OTHER)
Admission: RE | Disposition: A | Discharge status: Home / Self Care | Payer: Self-pay | Source: Home / Self Care | Attending: Obstetrics and Gynecology

## 2019-11-24 ENCOUNTER — Encounter (HOSPITAL_BASED_OUTPATIENT_CLINIC_OR_DEPARTMENT_OTHER): Payer: Self-pay | Admitting: Obstetrics and Gynecology

## 2019-11-24 ENCOUNTER — Ambulatory Visit (HOSPITAL_BASED_OUTPATIENT_CLINIC_OR_DEPARTMENT_OTHER)
Admission: RE | Admit: 2019-11-24 | Discharge: 2019-11-24 | Disposition: A | Discharge status: Home / Self Care | Payer: Self-pay | Attending: Obstetrics and Gynecology | Admitting: Obstetrics and Gynecology

## 2019-11-24 DIAGNOSIS — Z79890 Hormone replacement therapy (postmenopausal): Secondary | ICD-10-CM | POA: Insufficient documentation

## 2019-11-24 DIAGNOSIS — Z538 Procedure and treatment not carried out for other reasons: Secondary | ICD-10-CM | POA: Insufficient documentation

## 2019-11-24 DIAGNOSIS — E039 Hypothyroidism, unspecified: Secondary | ICD-10-CM | POA: Insufficient documentation

## 2019-11-24 DIAGNOSIS — Z79899 Other long term (current) drug therapy: Secondary | ICD-10-CM | POA: Insufficient documentation

## 2019-11-24 DIAGNOSIS — I10 Essential (primary) hypertension: Secondary | ICD-10-CM | POA: Insufficient documentation

## 2019-11-24 DIAGNOSIS — N950 Postmenopausal bleeding: Secondary | ICD-10-CM | POA: Insufficient documentation

## 2019-11-24 SURGERY — EXAM UNDER ANESTHESIA
Anesthesia: General | Site: Vagina

## 2019-11-24 MED ORDER — LACTATED RINGERS IV SOLN: INTRAVENOUS | Status: DC

## 2019-11-24 MED ORDER — PROPOFOL 10 MG/ML IV BOLUS
INTRAVENOUS | Status: AC
  Filled 2019-11-24: qty 20

## 2019-11-24 MED ORDER — MIDAZOLAM HCL 2 MG/2ML IJ SOLN: 1.0000 mg | INTRAMUSCULAR | Status: DC | PRN

## 2019-11-24 MED ORDER — ACETAMINOPHEN 500 MG PO TABS: 1000.0000 mg | ORAL_TABLET | Freq: Once | ORAL | Status: DC

## 2019-11-24 MED ORDER — ONDANSETRON HCL 4 MG/2ML IJ SOLN
INTRAMUSCULAR | Status: AC
  Filled 2019-11-24: qty 2

## 2019-11-24 MED ORDER — DEXAMETHASONE SODIUM PHOSPHATE 10 MG/ML IJ SOLN
INTRAMUSCULAR | Status: AC
  Filled 2019-11-24: qty 1

## 2019-11-24 MED ORDER — FAMOTIDINE 20 MG PO TABS: 20.0000 mg | ORAL_TABLET | Freq: Once | ORAL | Status: DC

## 2019-11-24 MED ORDER — MIDAZOLAM HCL 2 MG/2ML IJ SOLN
INTRAMUSCULAR | Status: AC
  Filled 2019-11-24: qty 2

## 2019-11-24 MED ORDER — FENTANYL CITRATE (PF) 100 MCG/2ML IJ SOLN: 50.0000 ug | INTRAMUSCULAR | Status: DC | PRN

## 2019-11-24 MED ORDER — LIDOCAINE 2% (20 MG/ML) 5 ML SYRINGE
INTRAMUSCULAR | Status: AC
  Filled 2019-11-24: qty 5

## 2019-11-24 MED ORDER — FENTANYL CITRATE (PF) 100 MCG/2ML IJ SOLN
INTRAMUSCULAR | Status: AC
  Filled 2019-11-24: qty 2

## 2019-11-24 NOTE — H&P (Signed)
Obstetrics & Gynecology Pre Operative H&P   Date of Surgery: 11/24/2019   Primary OBGYN: [facility] Primary Care Provider: Gildardo Pounds  Chief Complaint: scheduled surgery (hysteroscopy, d&c) for persistent post menopausal bleeding  History of Present Illness: Ms. Cuba is a 54 y.o. [phone] (Patient's last menstrual period was 11/05/2019.), with the above CC. PMHx is significant for HTN.   Last visit on 2/10 where I discussed with her that her prior labs results were consistent with being in menopause. I recommended surgery, which she was amenable to.  Patient is a Dr. Hulan Fray patient and has been followed by her for this and back to 2016 by other GYN providers. Her workup includes a negative 06/2018 embx, negative pap in 04/2018. 05/2018 u/s showed focal endometrial thickening and some vascularity (see below).   She was previously offered d&c or LNG IUD and she declined this from Dr. Hulan Fray.  She denies any current VB  GYN History Periods:qmonth and sometimes can be every other month. 8-9 days, starts light, then heavy 3-4 days and then light. No painful. No intermenstrual bleeding History of Pap Smear(s):Yes.Last pap 2019, which wasnegative    ROS: A 12-point review of systems was performed and negative, except as stated in the above HPI.  OBGYN History: As per HPI. OB History  Gravida Para Term Preterm AB Living  4       1 3   SAB TAB Ectopic Multiple Live Births  1       3    # Outcome Date GA Lbr Len/2nd Weight Sex Delivery Anes PTL Lv  4 SAB           3 Gravida      Vag-Spont     2 Gravida      Vag-Spont     1 Gravida      Vag-Spont        Past Medical History: Past Medical History:  Diagnosis Date  . Anemia   . Dysfunctional uterine bleeding   . Hypertension   . Iron deficiency anemia due to chronic blood loss 02/01/2016  . Sprain of left shoulder 12/21/2012  . Vitamin D insufficiency 12/22/2012    Past Surgical History: Past  Surgical History:  Procedure Laterality Date  . HYSTEROSCOPY WITH D & C  2013   in epic    Family History:  Family History  Problem Relation Age of Onset  . Diabetes Mother   . Hypertension Father     Social History:  Social History   Socioeconomic History  . Marital status: Married    Spouse name: Not on file  . Number of children: Not on file  . Years of education: Not on file  . Highest education level: Not on file  Occupational History  . Not on file  Tobacco Use  . Smoking status: Never Smoker  . Smokeless tobacco: Never Used  Substance and Sexual Activity  . Alcohol use: No  . Drug use: No  . Sexual activity: Yes    Birth control/protection: None  Other Topics Concern  . Not on file  Social History Narrative  . Not on file   Social Determinants of Health   Financial Resource Strain:   . Difficulty of Paying Living Expenses: Not on file  Food Insecurity:   . Worried About Charity fundraiser in the Last Year: Not on file  . Ran Out of Food in the Last Year: Not on file  Transportation Needs:   .  Lack of Transportation (Medical): Not on file  . Lack of Transportation (Non-Medical): Not on file  Physical Activity:   . Days of Exercise per Week: Not on file  . Minutes of Exercise per Session: Not on file  Stress:   . Feeling of Stress : Not on file  Social Connections:   . Frequency of Communication with Friends and Family: Not on file  . Frequency of Social Gatherings with Friends and Family: Not on file  . Attends Religious Services: Not on file  . Active Member of Clubs or Organizations: Not on file  . Attends Archivist Meetings: Not on file  . Marital Status: Not on file  Intimate Partner Violence:   . Fear of Current or Ex-Partner: Not on file  . Emotionally Abused: Not on file  . Physically Abused: Not on file  . Sexually Abused: Not on file    Allergy: No Known Allergies  Current Outpatient Medications: Medications Prior to  Admission  Medication Sig Dispense Refill Last Dose  . amLODipine (NORVASC) 10 MG tablet TAKE 1 TABLET (10 MG TOTAL) BY MOUTH DAILY. 30 tablet 2 11/24/2019 at Unknown time  . chlorthalidone (HYGROTON) 25 MG tablet Take 1 tablet (25 mg total) by mouth daily. Please fill as a 90 day supply 90 tablet 2 11/23/2019 at Unknown time  . levothyroxine (SYNTHROID) 88 MCG tablet Take 1 tablet (88 mcg total) by mouth daily. 30 tablet 0 11/24/2019 at Unknown time  . Vitamin D, Ergocalciferol, (DRISDOL) 1.25 MG (50000 UT) CAPS capsule Take 1 capsule (50,000 Units total) by mouth every 7 (seven) days. Please fill as a 90 day supply 12 capsule 1 11/23/2019 at Unknown time  . ferrous sulfate 325 (65 FE) MG EC tablet Take 1 tablet (325 mg total) by mouth daily with breakfast. 90 tablet 2      Hospital Medications: Current Facility-Administered Medications  Medication Dose Route Frequency Provider Last Rate Last Admin  . acetaminophen (TYLENOL) tablet 1,000 mg  1,000 mg Oral Once Ellender, Karyl Kinnier, MD      . famotidine (PEPCID) tablet 20 mg  20 mg Oral Once Ellender, Karyl Kinnier, MD      . fentaNYL (SUBLIMAZE) injection 50 mcg  50 mcg Intravenous Q5 min PRN Lynda Rainwater, MD      . lactated ringers infusion   Intravenous Continuous Lynda Rainwater, MD 10 mL/hr at 11/24/19 0949 New Bag at 11/24/19 0949  . lactated ringers infusion   Intravenous Continuous Aletha Halim, MD      . midazolam (VERSED) injection 1-2 mg  1-2 mg Intravenous Q5 min PRN Lynda Rainwater, MD         Physical Exam: Vitals:   11/18/19 0921 11/24/19 0934  BP:  140/82  Pulse:  (!) 110  Resp:  18  Temp:  98.2 F (36.8 C)  TempSrc:  Temporal  SpO2:  100%  Weight: 88.5 kg 87 kg  Height: 5\' 3"  (1.6 m) 5\' 3"  (1.6 m)    Temp:  [98.2 F (36.8 C)] 98.2 F (36.8 C) (02/24 0934) Pulse Rate:  [110] 110 (02/24 0934) Resp:  [18] 18 (02/24 0934) BP: (140)/(82) 140/82 (02/24 0934) SpO2:  [100 %] 100 % (02/24 0934) Weight:  [87 kg] 87  kg (02/24 0934) No intake/output data recorded. No intake/output data recorded. No intake or output data in the 24 hours ending 11/24/19 1015   Current Vital Signs 24h Vital Sign Ranges  T 98.2 F (36.8 C) Temp  Avg: 98.2 F (36.8 C)  Min: 98.2 F (36.8 C)  Max: 98.2 F (36.8 C)  BP 140/82 BP  Min: 140/82  Max: 140/82  HR (!) 110 Pulse  Avg: 110  Min: 110  Max: 110  RR 18 Resp  Avg: 18  Min: 18  Max: 18  SaO2 100 % Room Air SpO2  Avg: 100 %  Min: 100 %  Max: 100 %       24 Hour I/O Current Shift I/O  Time Ins Outs No intake/output data recorded. No intake/output data recorded.   Patient Vitals for the past 24 hrs:  BP Temp Temp src Pulse Resp SpO2 Height Weight  11/24/19 0934 140/82 98.2 F (36.8 C) Temporal (!) 110 18 100 % 5\' 3"  (1.6 m) 87 kg    Body mass index is 33.98 kg/m. General appearance: Well nourished, well developed female in no acute distress.    Laboratory: Negative: UPT, covid ECG unremarkable and unchanged Recent Labs  Lab 11/23/19 0930  NA 140  K 3.2*  CL 101  CO2 29  BUN 11  CREATININE 0.78  CALCIUM 9.3  GLUCOSE 114*   Imaging:  CLINICAL DATA:  Dysfunctional uterine bleeding  EXAM: ULTRASOUND PELVIS TRANSVAGINAL  TECHNIQUE: Transvaginal ultrasound examination of the pelvis was performed including evaluation of the uterus, ovaries, adnexal regions, and pelvic cul-de-sac.  COMPARISON:  None.  FINDINGS: Uterus  Measurements: 11.5 x 7.3 x 8.8 cm. 1.6 x 0.8 x 1.6 cm intramural fibroid in the posterior uterine body.  Endometrium  Thickness: 13 mm. Focal thickening in the uterine fundus with associated mild vascularity.  Right ovary  Not discretely visualized.  No adnexal mass is seen.  Left ovary  Not discretely visualized. No adnexal mass is seen.  Other findings:  No abnormal free fluid  IMPRESSION: Focal endometrial thickening in the uterine fundus, measuring 13 mm, with mild vascularity. This appearance  raises concern for endometrial polyp. Consider hysteroscopy and/or endometrial sampling, as clinically warranted.  1.6 cm intramural fibroid in the posterior uterine body.  Bilateral ovaries are not discretely visualized. No adnexal mass is seen.   Electronically Signed   By: Julian Hy M.D.   On: 06/10/2018 10:42  Assessment: Ms. Mensing is a 54 y.o. [phone] (Patient's last menstrual period was 11/05/2019.) here for hysteroscopy d&c for persistent post menopausal bleeding; pt stable  Plan: Patient's husband desired to speak me to pre surgery so I spoke to him separately. He has concerns as to why not just do a hysterectomy. He states that two of her sisters have needed hysterectomies and the patient had this procedure 8 years ago and it didn't help. I told him that right now she is menopausal and back then she likely wasn't, which makes a difference in surgical outcomes of the procedure. I said that she likely has about an 80% chance of success with just a hysteroscopy, d&c today, which would mean fully evaluating the cavity for anything missed on the office embx and avoiding a major surgery (risks d/w him re: this), but he strongly desires a hysterectomy, but ultimately he said it was up to her what she would like to do.  I then had a long d/w her re: this and she desires a hysterectomy. I told her we can't do it and will have to re-schedule and she was understanding of this. I told her that if any future concerns arise to please let the office know and I can call her and/or her husband  beforehand.    Durene Romans MD Attending [facility] (Faculty Practice) [phone]

## 2019-11-24 NOTE — Anesthesia Preprocedure Evaluation (Addendum)
Anesthesia Evaluation  Patient identified by MRN, date of birth, ID band Patient awake    Reviewed: Allergy & Precautions, NPO status , Patient's Chart, lab work & pertinent test results  Airway Mallampati: I  TM Distance: >3 FB Neck ROM: Full    Dental no notable dental hx.    Pulmonary neg pulmonary ROS,    Pulmonary exam normal breath sounds clear to auscultation       Cardiovascular hypertension, Pt. on medications Normal cardiovascular exam Rhythm:Regular Rate:Normal  ECG: NSR, rate 96   Neuro/Psych negative neurological ROS  negative psych ROS   GI/Hepatic negative GI ROS, Neg liver ROS,   Endo/Other  Hypothyroidism   Renal/GU negative Renal ROS     Musculoskeletal negative musculoskeletal ROS (+)   Abdominal (+) + obese,   Peds  Hematology negative hematology ROS (+)   Anesthesia Other Findings PMB  Reproductive/Obstetrics hcg negative                            Anesthesia Physical Anesthesia Plan  ASA: II  Anesthesia Plan: General   Post-op Pain Management:    Induction: Intravenous  PONV Risk Score and Plan: 4 or greater and Midazolam, Dexamethasone, Ondansetron and Treatment may vary due to age or medical condition  Airway Management Planned: LMA  Additional Equipment:   Intra-op Plan:   Post-operative Plan: Extubation in OR  Informed Consent: I have reviewed the patients History and Physical, chart, labs and discussed the procedure including the risks, benefits and alternatives for the proposed anesthesia with the patient or authorized representative who has indicated his/her understanding and acceptance.     Dental advisory given  Plan Discussed with: CRNA  Anesthesia Plan Comments:        Anesthesia Quick Evaluation

## 2019-11-24 NOTE — OR Nursing (Signed)
Pt. Wants full hysterectomy. Dr. Ilda Basset cancelled case after talking with patient and patient husband

## 2019-12-06 ENCOUNTER — Telehealth: Payer: Self-pay

## 2019-12-06 NOTE — Telephone Encounter (Signed)
Telephoned patient at home number. Patient stated she will call back and schedule with [facility].

## 2019-12-20 ENCOUNTER — Other Ambulatory Visit: Payer: Self-pay

## 2019-12-20 ENCOUNTER — Ambulatory Visit: Payer: Self-pay | Attending: Nurse Practitioner

## 2019-12-20 DIAGNOSIS — D500 Iron deficiency anemia secondary to blood loss (chronic): Secondary | ICD-10-CM

## 2019-12-20 DIAGNOSIS — R7989 Other specified abnormal findings of blood chemistry: Secondary | ICD-10-CM

## 2019-12-21 LAB — CBC
Hematocrit: 35.5 % (ref 34.0–46.6)
Hemoglobin: 11.3 g/dL (ref 11.1–15.9)
MCH: 25.2 pg — ABNORMAL LOW (ref 26.6–33.0)
MCHC: 31.8 g/dL (ref 31.5–35.7)
MCV: 79 fL (ref 79–97)
Platelets: 450 10*3/uL (ref 150–450)
RBC: 4.48 x10E6/uL (ref 3.77–5.28)
RDW: 14.3 % (ref 11.7–15.4)
WBC: 6.5 10*3/uL (ref 3.4–10.8)

## 2019-12-21 LAB — TSH: TSH: 1.27 u[IU]/mL (ref 0.450–4.500)

## 2020-01-03 ENCOUNTER — Other Ambulatory Visit: Payer: Self-pay | Admitting: Nurse Practitioner

## 2020-01-03 MED FILL — LEVOTHYROXINE 88 MCG TABLET: 88 | 30 days supply | Qty: 30 | Fill #0

## 2020-01-13 ENCOUNTER — Telehealth: Payer: Self-pay

## 2020-01-13 ENCOUNTER — Encounter: Payer: Self-pay | Admitting: Internal Medicine

## 2020-01-13 DIAGNOSIS — H5789 Other specified disorders of eye and adnexa: Secondary | ICD-10-CM | POA: Insufficient documentation

## 2020-01-13 DIAGNOSIS — D259 Leiomyoma of uterus, unspecified: Secondary | ICD-10-CM | POA: Insufficient documentation

## 2020-01-13 NOTE — Telephone Encounter (Signed)
Called pt and requested that pt to please come to the office to sign the hysterectomy statement for her surgery in May.  Pt states that she will be able to come in on Monday 01/17/20 at 1100 to statement.    Mel Almond, RN

## 2020-01-15 ENCOUNTER — Other Ambulatory Visit: Payer: Self-pay

## 2020-01-15 ENCOUNTER — Ambulatory Visit: Payer: Self-pay | Admitting: Internal Medicine

## 2020-01-15 DIAGNOSIS — H5789 Other specified disorders of eye and adnexa: Secondary | ICD-10-CM

## 2020-01-15 DIAGNOSIS — D259 Leiomyoma of uterus, unspecified: Secondary | ICD-10-CM

## 2020-01-15 NOTE — Progress Notes (Signed)
Established Patient Office Visit  Subjective:  Patient ID: Abigail Gonzalez, female    DOB: Feb 09, 1966  Age: 54 y.o. MRN: [id number]  CC: Pt complain of redness of the eye. She is known to have anemia,hypertension, uterine fibroids and hypothyroidism no HX of recent injury.   HPI Abigail Gonzalez presents for  Redness of the eyes which is improving. She need ophthalmologist referral for eye glasses.   Past Medical History:  Diagnosis Date  . Anemia   . Dysfunctional uterine bleeding   . Eye redness   . Hypertension   . Iron deficiency anemia due to chronic blood loss 02/01/2016  . Sprain of left shoulder 12/21/2012  . Vitamin D insufficiency 12/22/2012    Past Surgical History:  Procedure Laterality Date  . HYSTEROSCOPY WITH D & C  2013   in epic    Family History  Problem Relation Age of Onset  . Diabetes Mother   . Hypertension Father   . Diabetes Sister   . Hypertension Sister   . Diabetes Brother   . Hypertension Brother     Social History   Socioeconomic History  . Marital status: Married    Spouse name: abdelazim,Elsheiah  . Number of children: 3  . Years of education: Herbalist  . Highest education level: Bachelor's degree (e.g., BA, AB, BS)  Occupational History  . Occupation: Not Employeed   Tobacco Use  . Smoking status: Never Smoker  . Smokeless tobacco: Never Used  Substance and Sexual Activity  . Alcohol use: No  . Drug use: No  . Sexual activity: Yes    Birth control/protection: None  Other Topics Concern  . Not on file  Social History Narrative  . Not on file   Social Determinants of Health   Financial Resource Strain:   . Difficulty of Paying Living Expenses:   Food Insecurity:   . Worried About Charity fundraiser in the Last Year:   . Arboriculturist in the Last Year:   Transportation Needs:   . Film/video editor (Medical):   Marland Kitchen Lack of Transportation (Non-Medical):   Physical Activity:   . Days of Exercise per Week:   .  Minutes of Exercise per Session:   Stress:   . Feeling of Stress :   Social Connections:   . Frequency of Communication with Friends and Family:   . Frequency of Social Gatherings with Friends and Family:   . Attends Religious Services:   . Active Member of Clubs or Organizations:   . Attends Archivist Meetings:   Marland Kitchen Marital Status:   Intimate Partner Violence:   . Fear of Current or Ex-Partner:   . Emotionally Abused:   Marland Kitchen Physically Abused:   . Sexually Abused:     Outpatient Medications Prior to Visit  Medication Sig Dispense Refill  . amLODipine (NORVASC) 10 MG tablet TAKE 1 TABLET (10 MG TOTAL) BY MOUTH DAILY. 30 tablet 2  . chlorthalidone (HYGROTON) 25 MG tablet Take 1 tablet (25 mg total) by mouth daily. Please fill as a 90 day supply 90 tablet 2  . ferrous sulfate 325 (65 FE) MG EC tablet Take 1 tablet (325 mg total) by mouth daily with breakfast. 90 tablet 2  . levothyroxine (SYNTHROID) 88 MCG tablet TAKE 1 TABLET (88 MCG TOTAL) BY MOUTH DAILY. 30 tablet 2  . Vitamin D, Ergocalciferol, (DRISDOL) 1.25 MG (50000 UT) CAPS capsule Take 1 capsule (50,000 Units total) by mouth every 7 (seven)  days. Please fill as a 90 day supply 12 capsule 1   No facility-administered medications prior to visit.    No Known Allergies  ROS Review of Systems  Eyes: Negative for photophobia, pain, discharge and itching.  Respiratory: Negative.       Objective:    Physical Exam  Vitals reviewed. Virtual visit. There were no vitals taken for this visit. Wt Readings from Last 3 Encounters:  11/24/19 191 lb 12.8 oz (87 kg)  11/08/19 195 lb (88.5 kg)  10/08/19 195 lb (88.5 kg)     Health Maintenance Due  Topic Date Due  . COLON CANCER SCREENING ANNUAL FOBT  Never done    There are no preventive care reminders to display for this patient.  Lab Results  Component Value Date   TSH 1.270 12/20/2019   Lab Results  Component Value Date   WBC 6.5 12/20/2019   HGB 11.3  12/20/2019   HCT 35.5 12/20/2019   MCV 79 12/20/2019   PLT 450 12/20/2019   Lab Results  Component Value Date   NA 140 11/23/2019   K 3.2 (L) 11/23/2019   CO2 29 11/23/2019   GLUCOSE 114 (H) 11/23/2019   BUN 11 11/23/2019   CREATININE 0.78 11/23/2019   BILITOT 0.3 08/06/2019   ALKPHOS 71 08/06/2019   AST 20 08/06/2019   ALT 28 08/06/2019   PROT 7.4 08/06/2019   ALBUMIN 4.0 08/06/2019   CALCIUM 9.3 11/23/2019   ANIONGAP 10 11/23/2019   Lab Results  Component Value Date   CHOL 155 08/06/2019   Lab Results  Component Value Date   HDL 48 08/06/2019   Lab Results  Component Value Date   LDLCALC 89 08/06/2019   Lab Results  Component Value Date   TRIG 97 08/06/2019   Lab Results  Component Value Date   CHOLHDL 3.2 08/06/2019   Lab Results  Component Value Date   HGBA1C 5.3 08/06/2019      Assessment & Plan:   Problem List Items Addressed This Visit      Genitourinary   Fibroid of uterus     Other   Eye redness      No orders of the defined types were placed in this encounter.   Follow-up: No follow-ups on file.  Ophthalmologist referral for eye glasses.Advised for the use of artificial tears over the counter for dryness.    Abigail Athens, MD

## 2020-01-18 ENCOUNTER — Encounter: Payer: Self-pay | Admitting: *Deleted

## 2020-02-17 NOTE — Pre-Procedure Instructions (Signed)
Abigail Gonzalez  02/17/2020      Your procedure is scheduled on Tuesday, May 25..  Report to [facility], Main Entrance or Entrance "A" at 5:30 AM                  Your surgery or procedure is scheduled for 7:30  A.M.   Call this number if you have problems the morning of surgery:[phone]  This is the number for the Pre- Surgical Desk.       For any other questions, please call [phone], Monday - Friday 8 AM - 4 PM.   Remember:  Do not eat or drink after midnight, Monday, May 24.     Take these medicines the morning of surgery with A SIP OF WATER : levothyroxine (SYNTHROID)  1 Week prior to surgery STOP taking Aspirin, Aspirin Products (Goody Powder, Excedrin Migraine), Ibuprofen (Advil), Naproxen (Aleve), Vitamins and Herbal Products (ie Fish Oil).    Special instructions:   [place]- Preparing For Surgery  Before surgery, you can play an important role. Because skin is not sterile, your skin needs to be as free of germs as possible. You can reduce the number of germs on your skin by washing with CHG (chlorahexidine gluconate) Soap before surgery.  CHG is an antiseptic cleaner which kills germs and bonds with the skin to continue killing germs even after washing.    Oral Hygiene is also important to reduce your risk of infection.  Remember - BRUSH YOUR TEETH THE MORNING OF SURGERY WITH YOUR REGULAR TOOTHPASTE  Please do not use if you have an allergy to CHG or antibacterial soaps. If your skin becomes reddened/irritated stop using the CHG.  Do not shave (including legs and underarms) for at least 48 hours prior to first CHG shower. It is OK to shave your face.  Please follow these instructions carefully.   1. Shower the NIGHT BEFORE SURGERY and the MORNING OF SURGERY with CHG.   2. If you chose to wash your hair, wash your hair first as usual with your normal shampoo.  After you shampoo, wash your face and private area with the soap you use at home,  then rinse your hair and body thoroughly to remove the shampoo and soap.  3. Use CHG as you would any other liquid soap. You can apply CHG directly to the skin and wash gently with a scrungie or a clean washcloth.   4. Apply the CHG Soap to your body ONLY FROM THE NECK DOWN.  Do not use on open wounds or open sores. Avoid contact with your eyes, ears, mouth and genitals (private parts).   5. Wash thoroughly, paying special attention to the area where your surgery will be performed.  6. Thoroughly rinse your body with warm water from the neck down.  7. DO NOT shower/wash with your normal soap after using and rinsing off the CHG Soap.  8. Pat yourself dry with a CLEAN TOWEL.  9. Wear CLEAN PAJAMAS to bed the night before surgery, wear comfortable clothes the morning of surgery  10. Place CLEAN SHEETS on your bed the night of your first shower and DO NOT SLEEP WITH PETS.  Day of Surgery: Shower as instructed above.  Do not wear lotions, powders, or perfumes, or deodorant. Please wear clean clothes to the hospital/surgery center.   Remember to brush your teeth WITH YOUR REGULAR TOOTHPASTE.  Do not wear jewelry, make-up or nail polish.  Do not shave  48 hours prior to surgery.   Do not bring valuables to the hospital.  [facility] is not responsible for any belongings or valuables.  Contacts, dentures or bridgework may not be worn into surgery.  Leave your suitcase in the car.  After surgery it may be brought to your room.  For patients admitted to the hospital, discharge time will be determined by your treatment team.  Patients discharged the day of surgery will not be allowed to drive home.   Please read over the following fact sheets that you were given: Coughing and Deep Breathing, Pain Booklet and Surgical Site Infections

## 2020-02-18 ENCOUNTER — Other Ambulatory Visit: Payer: Self-pay | Admitting: Obstetrics and Gynecology

## 2020-02-18 ENCOUNTER — Other Ambulatory Visit (HOSPITAL_COMMUNITY): Payer: Medicaid Other

## 2020-02-18 ENCOUNTER — Inpatient Hospital Stay (HOSPITAL_COMMUNITY)
Admission: RE | Admit: 2020-02-18 | Discharge: 2020-02-18 | Disposition: A | Discharge status: Home / Self Care | Payer: Medicaid Other | Source: Ambulatory Visit

## 2020-02-21 ENCOUNTER — Other Ambulatory Visit: Payer: Self-pay | Admitting: Family Medicine

## 2020-02-21 DIAGNOSIS — I10 Essential (primary) hypertension: Secondary | ICD-10-CM

## 2020-02-21 MED FILL — ?AMLODIPINE BESYL 10MG TABL: 10 | 30 days supply | Qty: 30 | Fill #0

## 2020-02-21 MED FILL — CHLORTHALIDONE 25 MG TAB: 25 | 90 days supply | Qty: 90 | Fill #3

## 2020-02-22 ENCOUNTER — Ambulatory Visit (HOSPITAL_COMMUNITY)
Admission: RE | Admit: 2020-02-22 | Payer: Medicaid Other | Source: Home / Self Care | Admitting: Obstetrics and Gynecology

## 2020-02-22 ENCOUNTER — Encounter (HOSPITAL_COMMUNITY): Admission: RE | Payer: Self-pay | Source: Home / Self Care

## 2020-02-22 SURGERY — HYSTERECTOMY, TOTAL, LAPAROSCOPIC, WITH SALPINGECTOMY
Anesthesia: Choice

## 2020-03-13 MED FILL — LEVOTHYROXINE 88 MCG TABLET: 88 | 30 days supply | Qty: 30 | Fill #1

## 2020-03-30 ENCOUNTER — Other Ambulatory Visit: Payer: Self-pay | Admitting: Family Medicine

## 2020-03-30 DIAGNOSIS — I10 Essential (primary) hypertension: Secondary | ICD-10-CM

## 2020-03-30 MED FILL — AMLODIPINE BESYLATE 10 MG T: 10 | 30 days supply | Qty: 30 | Fill #0

## 2020-04-14 MED FILL — LEVOTHYROXINE 88 MCG TABLET: 88 | 30 days supply | Qty: 30 | Fill #2

## 2020-05-16 ENCOUNTER — Encounter (HOSPITAL_COMMUNITY): Payer: Self-pay | Admitting: Emergency Medicine

## 2020-05-16 ENCOUNTER — Ambulatory Visit (HOSPITAL_COMMUNITY)
Admission: EM | Admit: 2020-05-16 | Discharge: 2020-05-16 | Disposition: A | Discharge status: Home / Self Care | Payer: Medicaid Other | Attending: Family Medicine | Admitting: Family Medicine

## 2020-05-16 ENCOUNTER — Ambulatory Visit (INDEPENDENT_AMBULATORY_CARE_PROVIDER_SITE_OTHER): Payer: Medicaid Other

## 2020-05-16 DIAGNOSIS — M79641 Pain in right hand: Secondary | ICD-10-CM

## 2020-05-16 DIAGNOSIS — S62309A Unspecified fracture of unspecified metacarpal bone, initial encounter for closed fracture: Secondary | ICD-10-CM
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
## 2020-05-16 NOTE — ED Notes (Signed)
Results called by [facility] radiology received from [facility] and given to Dr. Sabra Heck, who stated he was aware of findings.
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
## 2021-08-11 ENCOUNTER — Inpatient Hospital Stay (HOSPITAL_COMMUNITY): Payer: Self-pay

## 2021-08-11 ENCOUNTER — Inpatient Hospital Stay (HOSPITAL_COMMUNITY)
Admission: AD | Admit: 2021-08-11 | Discharge: 2021-08-12 | Disposition: A | Discharge status: Home / Self Care | Payer: Self-pay | Attending: Emergency Medicine | Admitting: Emergency Medicine

## 2021-08-11 ENCOUNTER — Other Ambulatory Visit: Payer: Self-pay

## 2021-08-11 DIAGNOSIS — N939 Abnormal uterine and vaginal bleeding, unspecified: Secondary | ICD-10-CM

## 2021-08-11 DIAGNOSIS — M549 Dorsalgia, unspecified: Secondary | ICD-10-CM | POA: Insufficient documentation

## 2021-08-11 DIAGNOSIS — N950 Postmenopausal bleeding: Secondary | ICD-10-CM | POA: Insufficient documentation

## 2021-08-11 DIAGNOSIS — I10 Essential (primary) hypertension: Secondary | ICD-10-CM | POA: Insufficient documentation

## 2021-08-11 DIAGNOSIS — Z86018 Personal history of other benign neoplasm: Secondary | ICD-10-CM | POA: Insufficient documentation

## 2021-08-11 DIAGNOSIS — D649 Anemia, unspecified: Secondary | ICD-10-CM | POA: Insufficient documentation

## 2021-08-11 DIAGNOSIS — R42 Dizziness and giddiness: Secondary | ICD-10-CM | POA: Insufficient documentation

## 2021-08-11 DIAGNOSIS — N8500 Endometrial hyperplasia, unspecified: Secondary | ICD-10-CM

## 2021-08-11 DIAGNOSIS — D251 Intramural leiomyoma of uterus: Secondary | ICD-10-CM | POA: Insufficient documentation

## 2021-08-11 LAB — CBC WITH DIFFERENTIAL/PLATELET
Abs Immature Granulocytes: 0.17 10*3/uL — ABNORMAL HIGH (ref 0.00–0.07)
Abs Immature Granulocytes: 0.1 10*3/uL — ABNORMAL HIGH (ref 0.00–0.07)
Basophils Absolute: 0 10*3/uL (ref 0.0–0.1)
Basophils Absolute: 0.1 10*3/uL (ref 0.0–0.1)
Basophils Relative: 0 %
Basophils Relative: 1 %
Eosinophils Absolute: 0.1 10*3/uL (ref 0.0–0.5)
Eosinophils Absolute: 0.1 10*3/uL (ref 0.0–0.5)
Eosinophils Relative: 1 %
Eosinophils Relative: 1 %
HCT: 24.1 % — ABNORMAL LOW (ref 36.0–46.0)
HCT: 24.4 % — ABNORMAL LOW (ref 36.0–46.0)
Hemoglobin: 7.4 g/dL — ABNORMAL LOW (ref 12.0–15.0)
Hemoglobin: 7.3 g/dL — ABNORMAL LOW (ref 12.0–15.0)
Immature Granulocytes: 2 %
Immature Granulocytes: 1 %
Lymphocytes Relative: 14 %
Lymphocytes Relative: 22 %
Lymphs Abs: 1.3 10*3/uL (ref 0.7–4.0)
Lymphs Abs: 2.2 10*3/uL (ref 0.7–4.0)
MCH: 24.7 pg — ABNORMAL LOW (ref 26.0–34.0)
MCH: 24.7 pg — ABNORMAL LOW (ref 26.0–34.0)
MCHC: 30.7 g/dL (ref 30.0–36.0)
MCHC: 29.9 g/dL — ABNORMAL LOW (ref 30.0–36.0)
MCV: 80.3 fL (ref 80.0–100.0)
MCV: 82.4 fL (ref 80.0–100.0)
Monocytes Absolute: 0.6 10*3/uL (ref 0.1–1.0)
Monocytes Absolute: 0.7 10*3/uL (ref 0.1–1.0)
Monocytes Relative: 6 %
Monocytes Relative: 7 %
Neutro Abs: 6.9 10*3/uL (ref 1.7–7.7)
Neutro Abs: 7 10*3/uL (ref 1.7–7.7)
Neutrophils Relative %: 77 %
Neutrophils Relative %: 68 %
Platelets: 440 10*3/uL — ABNORMAL HIGH (ref 150–400)
Platelets: 412 10*3/uL — ABNORMAL HIGH (ref 150–400)
RBC: 3 MIL/uL — ABNORMAL LOW (ref 3.87–5.11)
RBC: 2.96 MIL/uL — ABNORMAL LOW (ref 3.87–5.11)
RDW: 14.9 % (ref 11.5–15.5)
RDW: 15.1 % (ref 11.5–15.5)
WBC: 9 10*3/uL (ref 4.0–10.5)
WBC: 10 10*3/uL (ref 4.0–10.5)
nRBC: 0.3 % — ABNORMAL HIGH (ref 0.0–0.2)
nRBC: 0.2 % (ref 0.0–0.2)

## 2021-08-11 LAB — COMPREHENSIVE METABOLIC PANEL
ALT: 23 U/L (ref 0–44)
AST: 23 U/L (ref 15–41)
Albumin: 3.2 g/dL — ABNORMAL LOW (ref 3.5–5.0)
Alkaline Phosphatase: 75 U/L (ref 38–126)
Anion gap: 7 (ref 5–15)
BUN: 12 mg/dL (ref 6–20)
CO2: 25 mmol/L (ref 22–32)
Calcium: 8.7 mg/dL — ABNORMAL LOW (ref 8.9–10.3)
Chloride: 105 mmol/L (ref 98–111)
Creatinine, Ser: 0.61 mg/dL (ref 0.44–1.00)
GFR, Estimated: >60 mL/min (ref >60)
Glucose, Bld: 138 mg/dL — ABNORMAL HIGH (ref 70–99)
Potassium: 3.5 mmol/L (ref 3.5–5.1)
Sodium: 137 mmol/L (ref 135–145)
Total Bilirubin: 0.5 mg/dL (ref 0.3–1.2)
Total Protein: 6.6 g/dL (ref 6.5–8.1)

## 2021-08-11 LAB — POCT PREGNANCY, URINE: Preg Test, Ur: NEGATIVE

## 2021-08-11 LAB — SAMPLE TO BLOOD BANK

## 2021-08-11 MED ORDER — FERROUS SULFATE 325 (65 FE) MG PO TABS: 325.0000 mg | ORAL_TABLET | Freq: Every day | ORAL | 0 refills | Status: AC

## 2021-08-11 MED ORDER — SODIUM CHLORIDE 0.9 % IV BOLUS
1000.0000 mL | Freq: Once | INTRAVENOUS | Status: AC
  Administered 2021-08-11: 1000 mL via INTRAVENOUS

## 2021-08-11 NOTE — MAU Note (Signed)
Abigail Gonzalez is a 55 y.o. here in MAU reporting: bleeding started last Thursday. States bleeding is heavier than a period and is changing a pad 6 times per day. Having some mild back pain.  LMP: unknown  Onset of complaint: ongoing  Pain score: 3/10  Vitals:   08/11/21 1425  BP: (!) 151/86  Pulse: (!) 114  Resp: 18  Temp: 98.1 F (36.7 C)  SpO2: 100%     Lab orders placed from triage: upt

## 2021-08-11 NOTE — ED Provider Notes (Signed)
[facility] EMERGENCY DEPARTMENT Provider Note   CSN: [id number] Arrival date & time: 08/11/21  1346     History Chief Complaint  Patient presents with   Vaginal Bleeding   Back Pain    Abigail Gonzalez is a 55 y.o. female history of hypertension, fibroids, here presenting with vaginal bleeding. Patient has been having vaginal bleeding for the last 8 days. Patient states that she uses at least 6 pads a day. Patient states that she feels lightheaded and dizzy. She went to [facility] and was noted to be anemic with a hemoglobin of 7.4.  She was sent here for further evaluation.  Patient denies any vaginal discharge and is not concerned for STDs.  Patient has a history of fibroid  The history is provided by the patient.      Past Medical History:  Diagnosis Date   Anemia    Dysfunctional uterine bleeding    Eye redness    Hypertension    Iron deficiency anemia due to chronic blood loss 02/01/2016   Sprain of left shoulder 12/21/2012   Vitamin D insufficiency 12/22/2012    Patient Active Problem List   Diagnosis Date Noted   Fibroid of uterus 01/13/2020   Eye redness 01/13/2020   Postmenopausal bleeding 04/06/2018   Hypertension 12/21/2012   Anemia 12/21/2012    Past Surgical History:  Procedure Laterality Date   HYSTEROSCOPY WITH D & C  2013   in epic     OB History     Gravida  4   Para      Term      Preterm      AB  1   Living  3      SAB  1   IAB      Ectopic      Multiple      Live Births  3           Family History  Problem Relation Age of Onset   Diabetes Mother    Hypertension Father    Diabetes Sister    Hypertension Sister    Diabetes Brother    Hypertension Brother     Social History   Tobacco Use   Smoking status: Never   Smokeless tobacco: Never  Vaping Use   Vaping Use: Never used  Substance Use Topics   Alcohol use: No   Drug use: No    Home Medications Prior to Admission  medications   Medication Sig Start Date End Date Taking? Authorizing Provider  amLODipine (NORVASC) 10 MG tablet TAKE 1 TABLET (10 MG TOTAL) BY MOUTH DAILY. 03/30/20   Charlott Rakes, MD  Ascorbic Acid (VITAMIN C PO) Take 1,600 mg by mouth daily.    [provider]  chlorthalidone (HYGROTON) 25 MG tablet Take 1 tablet (25 mg total) by mouth daily. Please fill as a 90 day supply 08/06/19   Gildardo Pounds, NP  Cholecalciferol (DIALYVITE VITAMIN D 5000) 125 MCG (5000 UT) capsule Take 5,000 Units by mouth daily.    [provider]  ferrous sulfate 325 (65 FE) MG EC tablet Take 1 tablet (325 mg total) by mouth daily with breakfast. Patient not taking: Reported on 02/08/2020 08/07/19 02/08/20  Gildardo Pounds, NP  levothyroxine (SYNTHROID) 88 MCG tablet TAKE 1 TABLET (88 MCG TOTAL) BY MOUTH DAILY. Patient taking differently: Take 88 mcg by mouth daily before breakfast.  01/04/20 04/03/20  Charlott Rakes, MD  OVER THE COUNTER MEDICATION Take 4  tablets by mouth daily. Beef liver iron supplement    [provider]  Vitamin D, Ergocalciferol, (DRISDOL) 1.25 MG (50000 UT) CAPS capsule Take 1 capsule (50,000 Units total) by mouth every 7 (seven) days. Please fill as a 90 day supply Patient not taking: Reported on 02/08/2020 08/07/19   Gildardo Pounds, NP    Allergies    Patient has no known allergies.  Review of Systems   Review of Systems  Genitourinary:  Positive for vaginal bleeding.  Musculoskeletal:  Positive for back pain.  All other systems reviewed and are negative.  Physical Exam Updated Vital Signs BP 132/68   Pulse (!) 105   Temp 98.1 F (36.7 C) (Oral)   Resp 16   SpO2 100%   Physical Exam Vitals reviewed.  Constitutional:      Appearance: Normal appearance.  HENT:     Head: Normocephalic.     Nose: Nose normal.     Mouth/Throat:     Mouth: Mucous membranes are moist.  Eyes:     Pupils: Pupils are equal, round, and reactive to light.     Comments:  Conjunctiva slightly pale  Cardiovascular:     Rate and Rhythm: Normal rate and regular rhythm.     Pulses: Normal pulses.     Heart sounds: Normal heart sounds.  Pulmonary:     Effort: Pulmonary effort is normal.     Breath sounds: Normal breath sounds.  Abdominal:     General: Abdomen is flat.     Palpations: Abdomen is soft.  Genitourinary:    Comments: Patient has some blood at the os.  The os is closed.  No obvious uterine tenderness. Musculoskeletal:        General: Normal range of motion.     Cervical back: Normal range of motion and neck supple.  Skin:    General: Skin is warm.     Capillary Refill: Capillary refill takes less than 2 seconds.  Neurological:     General: No focal deficit present.     Mental Status: She is alert and oriented to person, place, and time.  Psychiatric:        Mood and Affect: Mood normal.        Behavior: Behavior normal.    ED Results / Procedures / Treatments   Labs (all labs ordered are listed, but only abnormal results are displayed) Labs Reviewed  CBC WITH DIFFERENTIAL/PLATELET - Abnormal; Notable for the following components:      Result Value   RBC 3.00 (*)    Hemoglobin 7.4 (*)    HCT 24.1 (*)    MCH 24.7 (*)    Platelets 440 (*)    nRBC 0.3 (*)    Abs Immature Granulocytes 0.17 (*)    All other components within normal limits  COMPREHENSIVE METABOLIC PANEL - Abnormal; Notable for the following components:   Glucose, Bld 138 (*)    Calcium 8.7 (*)    Albumin 3.2 (*)    All other components within normal limits  CBC WITH DIFFERENTIAL/PLATELET - Abnormal; Notable for the following components:   RBC 2.96 (*)    Hemoglobin 7.3 (*)    HCT 24.4 (*)    MCH 24.7 (*)    MCHC 29.9 (*)    Platelets 412 (*)    Abs Immature Granulocytes 0.10 (*)    All other components within normal limits  POCT PREGNANCY, URINE  SAMPLE TO BLOOD BANK    EKG None  Radiology  US PELVIC COMPLETE WITH TRANSVAGINAL  Result Date:  08/11/2021 CLINICAL DATA:  Abnormal uterine bleeding EXAM: TRANSABDOMINAL AND TRANSVAGINAL ULTRASOUND OF PELVIS TECHNIQUE: Both transabdominal and transvaginal ultrasound examinations of the pelvis were performed. Transabdominal technique was performed for global imaging of the pelvis including uterus, ovaries, adnexal regions, and pelvic cul-de-sac. It was necessary to proceed with endovaginal exam following the transabdominal exam to visualize the left ovary. COMPARISON:  06/10/2018 FINDINGS: Uterus Measurements: 10.1 x 6.5 x 7.7 cm = volume: 265 mL. 2.2 x 2.2 x 2.1 cm intramural posterior uterine body fibroid. Endometrium Thickness: 12 mm.  No focal abnormality visualized. Right ovary Not discretely visualized.  No adnexal mass is seen. Left ovary Measurements: 3.5 x 2.0 x 2.8 cm = volume: 10.5 mL. Normal appearance/no adnexal mass. Other findings No abnormal free fluid. IMPRESSION: Endometrial complex measures 12 mm. In the setting of post-menopausal bleeding, endometrial sampling is indicated to exclude carcinoma. If results are benign, sonohysterogram should be considered for focal lesion work-up. (Ref: Radiological Reasoning: Algorithmic Workup of Abnormal Vaginal Bleeding with Endovaginal Sonography and Sonohysterography. AJR 2008; 309:M07-68) 2.2 cm intramural posterior uterine body fibroid. Electronically Signed   By: Julian Hy M.D.   On: 08/11/2021 22:58    Procedures Procedures   Medications Ordered in ED Medications  sodium chloride 0.9 % bolus 1,000 mL (0 mLs Intravenous Stopped 08/11/21 2247)    ED Course  I have reviewed the triage vital signs and the nursing notes.  Pertinent labs & imaging results that were available during my care of the patient were reviewed by me and considered in my medical decision making (see chart for details).    MDM Rules/Calculators/A&P                           SANAYAH MUNRO is a 55 y.o. female here presenting with vaginal bleeding.   Patient has a history of fibroids.  Likely bleeding from fibroids. Will do pelvic exam and recheck CBC and get pelvic ultrasound   11:28 PM CBC stable at 7.3. US showed enlarged endometrial stripe. Recommend endometrial biopsy. She has GYN doctor she can follow up with. Will start on iron supplementation   Final Clinical Impression(s) / ED Diagnoses Final diagnoses:  Vaginal bleeding    Rx / DC Orders ED Discharge Orders     None        Drenda Freeze, MD 08/11/21 2329

## 2021-08-11 NOTE — MAU Provider Note (Signed)
Chief Complaint: No chief complaint on file.  None     SUBJECTIVE HPI: Abigail Gonzalez is a 55 y.o. [phone] who presents to maternity admissions for vaginal bleeding. She reports history of anemia with admission to the hospital.   Past Medical History:  Diagnosis Date   Anemia    Dysfunctional uterine bleeding    Eye redness    Hypertension    Iron deficiency anemia due to chronic blood loss 02/01/2016   Sprain of left shoulder 12/21/2012   Vitamin D insufficiency 12/22/2012   Past Surgical History:  Procedure Laterality Date   HYSTEROSCOPY WITH D & C  2013   in epic   Social History   Socioeconomic History   Marital status: Married    Spouse name: abdelazim,Elsheiah   Number of children: 3   Years of education: Herbalist   Highest education level: Bachelor's degree (e.g., BA, AB, BS)  Occupational History   Occupation: Not Employeed   Tobacco Use   Smoking status: Never   Smokeless tobacco: Never  Vaping Use   Vaping Use: Never used  Substance and Sexual Activity   Alcohol use: No   Drug use: No   Sexual activity: Yes    Birth control/protection: None  Other Topics Concern   Not on file  Social History Narrative   Not on file   Social Determinants of Health   Financial Resource Strain: Not on file  Food Insecurity: Not on file  Transportation Needs: Not on file  Physical Activity: Not on file  Stress: Not on file  Social Connections: Not on file  Intimate Partner Violence: Not on file   No current facility-administered medications on file prior to encounter.   Current Outpatient Medications on File Prior to Encounter  Medication Sig Dispense Refill   amLODipine (NORVASC) 10 MG tablet TAKE 1 TABLET (10 MG TOTAL) BY MOUTH DAILY. 30 tablet 0   Ascorbic Acid (VITAMIN C PO) Take 1,600 mg by mouth daily.     chlorthalidone (HYGROTON) 25 MG tablet Take 1 tablet (25 mg total) by mouth daily. Please fill as a 90 day supply 90 tablet 2   Cholecalciferol  (DIALYVITE VITAMIN D 5000) 125 MCG (5000 UT) capsule Take 5,000 Units by mouth daily.     ferrous sulfate 325 (65 FE) MG EC tablet Take 1 tablet (325 mg total) by mouth daily with breakfast. (Patient not taking: Reported on 02/08/2020) 90 tablet 2   levothyroxine (SYNTHROID) 88 MCG tablet TAKE 1 TABLET (88 MCG TOTAL) BY MOUTH DAILY. (Patient taking differently: Take 88 mcg by mouth daily before breakfast. ) 30 tablet 2   OVER THE COUNTER MEDICATION Take 4 tablets by mouth daily. Beef liver iron supplement     Vitamin D, Ergocalciferol, (DRISDOL) 1.25 MG (50000 UT) CAPS capsule Take 1 capsule (50,000 Units total) by mouth every 7 (seven) days. Please fill as a 90 day supply (Patient not taking: Reported on 02/08/2020) 12 capsule 1   No Known Allergies  ROS:  Review of Systems  Genitourinary:  Positive for vaginal bleeding.   I have reviewed patient's Past Medical Hx, Surgical Hx, Family Hx, Social Hx, medications and allergies.   Physical Exam  Patient Vitals for the past 24 hrs:  BP Temp Temp src Pulse Resp SpO2  08/11/21 1425 (!) 151/86 98.1 F (36.7 C) Oral (!) 114 18 100 %   Physical Exam Constitutional:      Appearance: Normal appearance.  Skin:    General: Skin is warm.  Neurological:     Mental Status: She is alert and oriented to person, place, and time.    MDM Patient requests transfer to ED for heavy bleeding.    ASSESSMENT MSE Complete  PLAN  Transfer to [facility] ED  CBC drawn prior to transfer  Spoke to Dr. Pearline Cables who is agreeable to transfer.   Lezlie Lye, NP 08/11/2021 2:36 PM

## 2021-08-11 NOTE — Discharge Instructions (Addendum)
You have anemia from vaginal bleeding  Your endometrium is thickened and I do recommend getting a biopsy to make sure is not cancer  Take iron pills   Please see your GYN doctor for follow-up  Return to ER if you have worse abdominal pain, vaginal bleeding, fever.

## 2021-08-11 NOTE — MAU Note (Signed)
Pt transferred to ED via wheelchair by NT

## 2021-08-11 NOTE — MAU Note (Signed)
Report given to [facility] triage RN

## 2021-08-12 NOTE — ED Notes (Signed)
Pt verbalizes understanding of d/c instructions. Prescriptions reviewed with patient. Pt ambulatory at d/c with all belongings and with family.   

## 2021-09-07 ENCOUNTER — Encounter: Payer: Self-pay | Admitting: Obstetrics and Gynecology

## 2021-09-07 ENCOUNTER — Ambulatory Visit: Payer: Medicaid Other | Admitting: Obstetrics and Gynecology

## 2021-09-09 NOTE — Progress Notes (Signed)
Patient did not keep her ED referral appointment for 09/07/2021.  Durene Romans MD Attending Center for [employer] [facility])

## 2022-08-26 ENCOUNTER — Encounter (HOSPITAL_COMMUNITY): Payer: Self-pay

## 2022-08-26 ENCOUNTER — Ambulatory Visit (HOSPITAL_COMMUNITY)
Admission: EM | Admit: 2022-08-26 | Discharge: 2022-08-26 | Disposition: A | Discharge status: Home / Self Care | Payer: Self-pay | Attending: Family Medicine | Admitting: Family Medicine

## 2022-08-26 DIAGNOSIS — H6691 Otitis media, unspecified, right ear: Secondary | ICD-10-CM

## 2022-08-26 DIAGNOSIS — J0100 Acute maxillary sinusitis, unspecified: Secondary | ICD-10-CM

## 2022-08-26 MED ORDER — BENZONATATE 100 MG PO CAPS: 100.0000 mg | ORAL_CAPSULE | Freq: Three times a day (TID) | ORAL | 0 refills | Status: AC | PRN

## 2022-08-26 MED ORDER — AMOXICILLIN-POT CLAVULANATE 875-125 MG PO TABS: 1.0000 | ORAL_TABLET | Freq: Two times a day (BID) | ORAL | 0 refills | Status: AC

## 2022-08-26 NOTE — ED Triage Notes (Signed)
Pt is here for cough x2wks, runny nose and nasal congestion , body aches ,right ear pain x3 days pharyngitis since today .

## 2022-08-26 NOTE — ED Provider Notes (Signed)
[facility]    CSN: [id number] Arrival date & time: 08/26/22  1851      History   Chief Complaint Chief Complaint  Patient presents with   Cough   Otalgia   Nasal Congestion    HPI Abigail Gonzalez is a 56 y.o. female.    Cough Associated symptoms: ear pain   Otalgia Associated symptoms: cough    Here for cough and nasal congestion that is been going on for about 2 weeks.  The congestion worsened about 4 days ago and then yesterday she began having right ear pain and sore throat.  No fever and no vomiting or diarrhea.  She has also been very hoarse  She does have a history of hypertension and has not been taking much but Tylenol as needed p Past Medical History:  Diagnosis Date   Anemia    Dysfunctional uterine bleeding    Eye redness    Hypertension    Iron deficiency anemia due to chronic blood loss 02/01/2016   Sprain of left shoulder 12/21/2012   Vitamin D insufficiency 12/22/2012    Patient Active Problem List   Diagnosis Date Noted   Fibroid of uterus 01/13/2020   Eye redness 01/13/2020   Postmenopausal bleeding 04/06/2018   Hypertension 12/21/2012   Anemia 12/21/2012    Past Surgical History:  Procedure Laterality Date   HYSTEROSCOPY WITH D & C  2013   in epic    OB History     Gravida  4   Para      Term      Preterm      AB  1   Living  3      SAB  1   IAB      Ectopic      Multiple      Live Births  3            Home Medications    Prior to Admission medications   Medication Sig Start Date End Date Taking? Authorizing Provider  amoxicillin-clavulanate (AUGMENTIN) 875-125 MG tablet Take 1 tablet by mouth 2 (two) times daily for 7 days. 08/26/22 09/02/22 Yes Renezmae Canlas, Gwenlyn Perking, MD  benzonatate (TESSALON) 100 MG capsule Take 1 capsule (100 mg total) by mouth 3 (three) times daily as needed for cough. 08/26/22  Yes Barrett Henle, MD  amLODipine (NORVASC) 10 MG tablet TAKE 1 TABLET (10 MG TOTAL) BY MOUTH  DAILY. 03/30/20   Charlott Rakes, MD  Ascorbic Acid (VITAMIN C PO) Take 1,600 mg by mouth daily.    [provider]  chlorthalidone (HYGROTON) 25 MG tablet Take 1 tablet (25 mg total) by mouth daily. Please fill as a 90 day supply 08/06/19   Gildardo Pounds, NP  Cholecalciferol (DIALYVITE VITAMIN D 5000) 125 MCG (5000 UT) capsule Take 5,000 Units by mouth daily.    [provider]  ferrous sulfate 325 (65 FE) MG tablet Take 1 tablet (325 mg total) by mouth daily. 08/11/21   Drenda Freeze, MD  levothyroxine (SYNTHROID) 88 MCG tablet TAKE 1 TABLET (88 MCG TOTAL) BY MOUTH DAILY. Patient taking differently: Take 88 mcg by mouth daily before breakfast.  01/04/20 04/03/20  Charlott Rakes, MD  OVER THE COUNTER MEDICATION Take 4 tablets by mouth daily. Beef liver iron supplement    [provider]  Vitamin D, Ergocalciferol, (DRISDOL) 1.25 MG (50000 UT) CAPS capsule Take 1 capsule (50,000 Units total) by mouth every 7 (seven) days. Please fill as  a 90 day supply Patient not taking: Reported on 02/08/2020 08/07/19   Gildardo Pounds, NP    Family History Family History  Problem Relation Age of Onset   Diabetes Mother    Hypertension Father    Diabetes Sister    Hypertension Sister    Diabetes Brother    Hypertension Brother     Social History Social History   Tobacco Use   Smoking status: Never   Smokeless tobacco: Never  Vaping Use   Vaping Use: Never used  Substance Use Topics   Alcohol use: No   Drug use: No     Allergies   Patient has no known allergies.   Review of Systems Review of Systems  HENT:  Positive for ear pain.   Respiratory:  Positive for cough.      Physical Exam Triage Vital Signs ED Triage Vitals  Enc Vitals Group     BP 08/26/22 1937 (!) 140/76     Pulse Rate 08/26/22 1937 (!) 102     Resp 08/26/22 1937 16     Temp 08/26/22 1937 98 F (36.7 C)     Temp Source 08/26/22 1937 Oral     SpO2 08/26/22 1937 100 %     Weight --       Height --      Head Circumference --      Peak Flow --      Pain Score 08/26/22 1934 6     Pain Loc --      Pain Edu? --      [place] --    No data found.  Updated Vital Signs BP (!) 140/76 (BP Location: Right Arm)   Pulse (!) 102   Temp 98 F (36.7 C) (Oral)   Resp 16   LMP 05/06/2020   SpO2 100%   Visual Acuity Right Eye Distance:   Left Eye Distance:   Bilateral Distance:    Right Eye Near:   Left Eye Near:    Bilateral Near:     Physical Exam Vitals reviewed.  Constitutional:      General: She is not in acute distress.    Appearance: She is not ill-appearing, toxic-appearing or diaphoretic.  HENT:     Right Ear: Ear canal normal.     Left Ear: Tympanic membrane and ear canal normal.     Ears:     Comments: Right tympanic membrane is bulging and dull     Nose: Nose normal.     Mouth/Throat:     Mouth: Mucous membranes are moist.  Eyes:     Extraocular Movements: Extraocular movements intact.     Conjunctiva/sclera: Conjunctivae normal.     Pupils: Pupils are equal, round, and reactive to light.  Cardiovascular:     Rate and Rhythm: Normal rate and regular rhythm.     Heart sounds: No murmur heard. Pulmonary:     Effort: Pulmonary effort is normal. No respiratory distress.     Breath sounds: No stridor. No wheezing, rhonchi or rales.  Musculoskeletal:     Cervical back: Neck supple.  Lymphadenopathy:     Cervical: No cervical adenopathy.  Skin:    Capillary Refill: Capillary refill takes less than 2 seconds.     Coloration: Skin is not jaundiced or pale.  Neurological:     General: No focal deficit present.     Mental Status: She is alert and oriented to person, place, and time.  Psychiatric:  Behavior: Behavior normal.      UC Treatments / Results  Labs (all labs ordered are listed, but only abnormal results are displayed) Labs Reviewed - No data to display  EKG   Radiology No results found.  Procedures Procedures  (including critical care time)  Medications Ordered in UC Medications - No data to display  Initial Impression / Assessment and Plan / UC Course  I have reviewed the triage vital signs and the nursing notes.  Pertinent labs & imaging results that were available during my care of the patient were reviewed by me and considered in my medical decision making (see chart for details).        I can treat for acute sinusitis and otitis media. Final Clinical Impressions(s) / UC Diagnoses   Final diagnoses:  Right otitis media, unspecified otitis media type  Acute maxillary sinusitis, recurrence not specified     Discharge Instructions      Take amoxicillin-clavulanate 875 mg--1 tab twice daily with food for 7 days,  Take benzonatate 100 mg, 1 tab every 8 hours as needed for cough.  Try Mucinex DM as needed for congestion and cough.  It is safe to take with hypertension     ED Prescriptions     Medication Sig Dispense Auth. Provider   amoxicillin-clavulanate (AUGMENTIN) 875-125 MG tablet Take 1 tablet by mouth 2 (two) times daily for 7 days. 14 tablet Kamaljit Hizer, Gwenlyn Perking, MD   benzonatate (TESSALON) 100 MG capsule Take 1 capsule (100 mg total) by mouth 3 (three) times daily as needed for cough. 21 capsule Barrett Henle, MD      PDMP not reviewed this encounter.   Barrett Henle, MD 08/26/22 2012

## 2022-08-26 NOTE — Discharge Instructions (Signed)
Take amoxicillin-clavulanate 875 mg--1 tab twice daily with food for 7 days,  Take benzonatate 100 mg, 1 tab every 8 hours as needed for cough.  Try Mucinex DM as needed for congestion and cough.  It is safe to take with hypertension
